# Patient Record
Sex: Female | Born: 1980 | Race: White | Hispanic: Yes | Marital: Married | Smoking: Former smoker
Health system: Southern US, Community
[De-identification: ages and names within clinical notes are randomized; demographics above are authoritative.]

## PROBLEM LIST (undated history)

## (undated) ENCOUNTER — Inpatient Hospital Stay (HOSPITAL_COMMUNITY): Payer: Self-pay

## (undated) DIAGNOSIS — D649 Anemia, unspecified: Secondary | ICD-10-CM

## (undated) DIAGNOSIS — E039 Hypothyroidism, unspecified: Secondary | ICD-10-CM

## (undated) DIAGNOSIS — T7840XA Allergy, unspecified, initial encounter: Secondary | ICD-10-CM

## (undated) HISTORY — DX: Allergy, unspecified, initial encounter: T78.40XA

## (undated) HISTORY — DX: Anemia, unspecified: D64.9

## (undated) HISTORY — PX: VULVA SURGERY: SHX837

## (undated) HISTORY — DX: Hypothyroidism, unspecified: E03.9

## (undated) HISTORY — PX: WISDOM TOOTH EXTRACTION: SHX21

---

## 2005-07-19 DIAGNOSIS — E039 Hypothyroidism, unspecified: Secondary | ICD-10-CM

## 2005-07-19 HISTORY — DX: Hypothyroidism, unspecified: E03.9

## 2016-06-22 DIAGNOSIS — Z30433 Encounter for removal and reinsertion of intrauterine contraceptive device: Secondary | ICD-10-CM | POA: Diagnosis not present

## 2016-11-24 ENCOUNTER — Encounter: Payer: Self-pay | Admitting: Internal Medicine

## 2016-11-24 ENCOUNTER — Ambulatory Visit (INDEPENDENT_AMBULATORY_CARE_PROVIDER_SITE_OTHER): Payer: BLUE CROSS/BLUE SHIELD | Admitting: Internal Medicine

## 2016-11-24 VITALS — BP 108/62 | HR 72 | Temp 98.2°F | Resp 14 | Ht 65.0 in | Wt 124.5 lb

## 2016-11-24 DIAGNOSIS — I7300 Raynaud's syndrome without gangrene: Secondary | ICD-10-CM | POA: Insufficient documentation

## 2016-11-24 DIAGNOSIS — E039 Hypothyroidism, unspecified: Secondary | ICD-10-CM | POA: Diagnosis not present

## 2016-11-24 DIAGNOSIS — Z0000 Encounter for general adult medical examination without abnormal findings: Secondary | ICD-10-CM

## 2016-11-24 DIAGNOSIS — Z114 Encounter for screening for human immunodeficiency virus [HIV]: Secondary | ICD-10-CM | POA: Diagnosis not present

## 2016-11-24 DIAGNOSIS — L989 Disorder of the skin and subcutaneous tissue, unspecified: Secondary | ICD-10-CM

## 2016-11-24 DIAGNOSIS — L853 Xerosis cutis: Secondary | ICD-10-CM

## 2016-11-24 NOTE — Progress Notes (Signed)
Subjective:    Patient ID: Sharon Burch, female    DOB: 08-26-1980, 36 y.o.   MRN: [id number]  DOS:  11/24/2016 Type of visit - description : cpx, new pt Interval history: In general feeling well, she has very healthy diet and exercise regularly.    Review of Systems She reports that for many years, when she is exposed to a cold environment, her fingers or toes get extremely white temporarily, better after she massage them. Also, over the last year has noted a "rash" on the lower extremities described as white dots. No itching, no scales    Other than above, a 14 point review of systems is negative    Past Medical History:  Diagnosis Date  . Allergy   . Anemia    hx of  . Hypothyroidism 2007   treated x 4 years, then apparently TSH normalized    Past Surgical History:  Procedure Laterality Date  . VULVA SURGERY     as child    Social History   Social History  . Marital status: Married    Spouse name: N/A  . Number of children: 0  . Years of education: N/A   Occupational History  . Clinical research associategraphic designer  [employer]   Social History Main Topics  . Smoking status: Current Every Day Smoker    Types: E-cigarettes  . Smokeless tobacco: Never Used     Comment: on-off   . Alcohol use Yes     Comment: socially  . Drug use: No  . Sexual activity: Yes    Birth control/ protection: IUD   Other Topics Concern  . Not on file   Social History Narrative   G0P0     Family History  Problem Relation Age of Onset  . Diabetes Maternal Grandmother   . Skin cancer Paternal Grandfather   . Heart failure Neg Hx   . Colon cancer Neg Hx   . Breast cancer Neg Hx      Allergies as of 11/24/2016      Reactions   Penicillins Rash      Medication List       Accurate as of 11/24/16 11:59 PM. Always use your most recent med list.          levonorgestrel 20 MCG/24HR IUD Commonly known as:  MIRENA 1 each by Intrauterine route once.          Objective:   Physical  Exam BP 108/62 (BP Location: Left Arm, Patient Position: Sitting, Cuff Size: Small)   Pulse 72   Temp 98.2 F (36.8 C) (Oral)   Resp 14   Ht 5\' 5"  (1.651 m)   Wt 124 lb 8 oz (56.5 kg)   SpO2 98%   BMI 20.72 kg/m     General:   Well developed, well nourished . NAD.  Neck: No  thyromegaly  HEENT:  Normocephalic . Face symmetric, atraumatic Lungs:  CTA B Normal respiratory effort, no intercostal retractions, no accessory muscle use. Heart: RRR,  no murmur.  No pretibial edema bilaterally  Abdomen:  Not distended, soft, non-tender. No rebound or rigidity.   Skin: Lower extremities without rash, the "white dots" seems to be a reticular pattern Neurologic:  alert & oriented X3.  Speech normal, gait appropriate for age and unassisted Strength symmetric and appropriate for age.  Psych: Cognition and judgment appear intact.  Cooperative with normal attention span and concentration.  Behavior appropriate. No anxious or depressed appearing.  Pictures taken by the pt  before:            Assessment & Plan:   Assessment Hypothyroidism:  Dx ~ 2007, treated x 4 years, then apparently TSH normalized Raynaud phenomena/disease  Plan: H/o thyroid dz, currently on no medications, checking PFTs Raynaud phenomena: Clearly has a Raynaud phenomena, see pictures taken by the patient previously, exam today is completely normal. She also have some reticular pattern at the legs. Will refer to rheumatology, I wonder if she needs further laboratory testing. For now recommend avoidance of triggers such as cold environment. Mole, dry scalp, would like to see a dermatologist. Will arrange RTC one year

## 2016-11-24 NOTE — Progress Notes (Signed)
Pre visit review using our clinic review tool, if applicable. No additional management support is needed unless otherwise documented below in the visit note. 

## 2016-11-24 NOTE — Patient Instructions (Signed)
GO TO THE LAB : Get the blood work     GO TO THE FRONT DESK Schedule your next appointment for a  physical exam in one year  

## 2016-11-25 DIAGNOSIS — Z09 Encounter for follow-up examination after completed treatment for conditions other than malignant neoplasm: Secondary | ICD-10-CM | POA: Insufficient documentation

## 2016-11-25 LAB — LIPID PANEL
CHOL/HDL RATIO: 3
Cholesterol: 186 mg/dL (ref 0–200)
HDL: 69.2 mg/dL (ref >39.00)
LDL Cholesterol: 100 mg/dL — ABNORMAL HIGH (ref 0–99)
NONHDL: 116.52
Triglycerides: 84 mg/dL (ref 0.0–149.0)
VLDL: 16.8 mg/dL (ref 0.0–40.0)

## 2016-11-25 LAB — COMPREHENSIVE METABOLIC PANEL
ALT: 16 U/L (ref 0–35)
AST: 18 U/L (ref 0–37)
Albumin: 5 g/dL (ref 3.5–5.2)
Alkaline Phosphatase: 37 U/L — ABNORMAL LOW (ref 39–117)
BUN: 14 mg/dL (ref 6–23)
CHLORIDE: 103 meq/L (ref 96–112)
CO2: 29 meq/L (ref 19–32)
Calcium: 9.9 mg/dL (ref 8.4–10.5)
Creatinine, Ser: 0.69 mg/dL (ref 0.40–1.20)
GFR: 103.03 mL/min (ref >60.00)
GLUCOSE: 92 mg/dL (ref 70–99)
POTASSIUM: 4 meq/L (ref 3.5–5.1)
SODIUM: 139 meq/L (ref 135–145)
TOTAL PROTEIN: 7.9 g/dL (ref 6.0–8.3)
Total Bilirubin: 0.3 mg/dL (ref 0.2–1.2)

## 2016-11-25 LAB — CBC WITH DIFFERENTIAL/PLATELET
Basophils Absolute: 0 10*3/uL (ref 0.0–0.1)
Basophils Relative: 0.5 % (ref 0.0–3.0)
EOS ABS: 0.1 10*3/uL (ref 0.0–0.7)
Eosinophils Relative: 0.9 % (ref 0.0–5.0)
HCT: 41 % (ref 36.0–46.0)
HEMOGLOBIN: 13.6 g/dL (ref 12.0–15.0)
Lymphocytes Relative: 18.2 % (ref 12.0–46.0)
Lymphs Abs: 1.9 10*3/uL (ref 0.7–4.0)
MCHC: 33.3 g/dL (ref 30.0–36.0)
MCV: 94.9 fl (ref 78.0–100.0)
MONO ABS: 0.5 10*3/uL (ref 0.1–1.0)
Monocytes Relative: 4.5 % (ref 3.0–12.0)
NEUTROS PCT: 75.9 % (ref 43.0–77.0)
Neutro Abs: 8.1 10*3/uL — ABNORMAL HIGH (ref 1.4–7.7)
Platelets: 270 10*3/uL (ref 150.0–400.0)
RBC: 4.32 Mil/uL (ref 3.87–5.11)
RDW: 13.6 % (ref 11.5–15.5)
WBC: 10.7 10*3/uL — AB (ref 4.0–10.5)

## 2016-11-25 LAB — T3, FREE: T3, Free: 3.4 pg/mL (ref 2.3–4.2)

## 2016-11-25 LAB — TSH: TSH: 0.87 u[IU]/mL (ref 0.35–4.50)

## 2016-11-25 LAB — HIV ANTIBODY (ROUTINE TESTING W REFLEX): HIV: NONREACTIVE

## 2016-11-25 LAB — T4, FREE: Free T4: 0.69 ng/dL (ref 0.60–1.60)

## 2016-11-25 NOTE — Assessment & Plan Note (Signed)
--  Td ~ 2017 --female care per gyn --Smoker: Counseling, previously Chantix causes nausea. We talk about different strategies to quit, the use of Wellbutrin and nicotine patch -- lifestyle is otherwise very healthy, she is active. Excellent diet. Labs: CMP, FLP, CBC, TSH, T3, T4, HIV.

## 2016-11-25 NOTE — Assessment & Plan Note (Signed)
H/o thyroid dz, currently on no medications, checking PFTs Raynaud phenomena: Clearly has a Raynaud phenomena, see pictures taken by the patient previously, exam today is completely normal. She also have some reticular pattern at the legs. Will refer to rheumatology, I wonder if she needs further laboratory testing. For now recommend avoidance of triggers such as cold environment. Mole, dry scalp, would like to see a dermatologist. Will arrange RTC one year

## 2016-12-01 ENCOUNTER — Telehealth: Payer: Self-pay | Admitting: *Deleted

## 2016-12-01 NOTE — Telephone Encounter (Signed)
Patient called about lab results.  She had not received them in the mail and saw them on mychart.  Advised her that all test look great.  See lab note.

## 2016-12-21 DIAGNOSIS — Z6820 Body mass index (BMI) 20.0-20.9, adult: Secondary | ICD-10-CM | POA: Diagnosis not present

## 2016-12-21 DIAGNOSIS — Z30432 Encounter for removal of intrauterine contraceptive device: Secondary | ICD-10-CM | POA: Diagnosis not present

## 2016-12-21 DIAGNOSIS — Z01419 Encounter for gynecological examination (general) (routine) without abnormal findings: Secondary | ICD-10-CM | POA: Diagnosis not present

## 2016-12-23 DIAGNOSIS — L700 Acne vulgaris: Secondary | ICD-10-CM | POA: Diagnosis not present

## 2016-12-23 DIAGNOSIS — L814 Other melanin hyperpigmentation: Secondary | ICD-10-CM | POA: Diagnosis not present

## 2016-12-23 DIAGNOSIS — L218 Other seborrheic dermatitis: Secondary | ICD-10-CM | POA: Diagnosis not present

## 2016-12-23 DIAGNOSIS — D485 Neoplasm of uncertain behavior of skin: Secondary | ICD-10-CM | POA: Diagnosis not present

## 2017-02-03 NOTE — Progress Notes (Deleted)
Office Visit Note  Patient: Sharon Burch             Date of Birth: Jan 03, 1981           MRN: [id number]             PCP: Wanda PlumpPaz, Jose E, MD Referring: Wanda PlumpPaz, Jose E, MD Visit Date: 02/08/2017 Occupation: @GUAROCC @    Subjective:  No chief complaint on file.   History of Present Illness: Sharon Burch is a 36 y.o. female ***   Activities of Daily Living:  Patient reports morning stiffness for *** {minute/hour:19697}.   Patient {ACTIONS;DENIES/REPORTS:21021675::"Denies"} nocturnal pain.  Difficulty dressing/grooming: {ACTIONS;DENIES/REPORTS:21021675::"Denies"} Difficulty climbing stairs: {ACTIONS;DENIES/REPORTS:21021675::"Denies"} Difficulty getting out of chair: {ACTIONS;DENIES/REPORTS:21021675::"Denies"} Difficulty using hands for taps, buttons, cutlery, and/or writing: {ACTIONS;DENIES/REPORTS:21021675::"Denies"}   No Rheumatology ROS completed.   PMFS History:  Patient Active Problem List   Diagnosis Date Noted  . PCP NOTES >>>>>>>>>>>>>>>>>> 11/25/2016  . Annual physical exam 11/24/2016  . Raynaud disease 11/24/2016  . Hypothyroidism 07/19/2005    Past Medical History:  Diagnosis Date  . Allergy   . Anemia    hx of  . Hypothyroidism 2007   treated x 4 years, then apparently TSH normalized    Family History  Problem Relation Age of Onset  . Diabetes Maternal Grandmother   . Skin cancer Paternal Grandfather   . Heart failure Neg Hx   . Colon cancer Neg Hx   . Breast cancer Neg Hx    Past Surgical History:  Procedure Laterality Date  . VULVA SURGERY     as child   Social History   Social History Narrative   G0P0     Objective: Vital Signs: There were no vitals taken for this visit.   Physical Exam   Musculoskeletal Exam: ***  CDAI Exam: No CDAI exam completed.    Investigation: Findings:  11/24/2016 TSH and HIV normal   CBC Latest Ref Rng & Units 11/24/2016  WBC 4.0 - 10.5 K/uL 10.7(H)  Hemoglobin 12.0 - 15.0 g/dL 40.913.6    Hematocrit 81.136.0 - 46.0 % 41.0  Platelets 150.0 - 400.0 K/uL 270.0   CMP Latest Ref Rng & Units 11/24/2016  Glucose 70 - 99 mg/dL 92  BUN 6 - 23 mg/dL 14  Creatinine 9.140.40 - 7.821.20 mg/dL 9.560.69  Sodium 213135 - 086145 mEq/L 139  Potassium 3.5 - 5.1 mEq/L 4.0  Chloride 96 - 112 mEq/L 103  CO2 19 - 32 mEq/L 29  Calcium 8.4 - 10.5 mg/dL 9.9  Total Protein 6.0 - 8.3 g/dL 7.9  Total Bilirubin 0.2 - 1.2 mg/dL 0.3  Alkaline Phos 39 - 117 U/L 37(L)  AST 0 - 37 U/L 18  ALT 0 - 35 U/L 16     Imaging: No results found.  Speciality Comments: No specialty comments available.    Procedures:  No procedures performed Allergies: Penicillins   Assessment / Plan:     Visit Diagnoses: Raynaud's disease without gangrene  History of hypothyroidism  History of anemia    Orders: No orders of the defined types were placed in this encounter.  No orders of the defined types were placed in this encounter.   Face-to-face time spent with patient was *** minutes. 50% of time was spent in counseling and coordination of care.  Follow-Up Instructions: No Follow-up on file.   Amy Littrell, RT  Note - This record has been created using [employer] software.  Chart creation errors have been sought, but may not always  have been  located. Such creation errors do not reflect on  the standard of medical care.

## 2017-02-07 NOTE — Progress Notes (Signed)
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   History of Present Illness: Sharon Burch is a 36 y.o. female seen in consultation per request of her PCP for evaluation of Raynaud's phenomenon. According to patient she has had symptoms of Raynauds and she was some 19. She reports that her body is exposed to cold temperature she's noticed that couple of her fingers will turn white and numb. She's also noticed changes in her toes. She's also seen some reticular pattern on her lower extremities. She states she has had problems with her knee joints for a while. Especially when she is going downhill. She has not noticed any swelling in her knee joints. She has some stiffness in her feet in the morning.  Activities of Daily Living:  Patient reports morning stiffness for 0 minute.   Patient Denies nocturnal pain.  Difficulty dressing/grooming: Denies Difficulty climbing stairs: Denies Difficulty getting out of chair: Denies Difficulty using hands for taps, buttons, cutlery, and/or writing: Denies   Review of Systems  Constitutional: Negative for fatigue, night sweats, weight gain, weight loss and weakness.  HENT: Negative for mouth sores, trouble swallowing, trouble swallowing, mouth dryness and nose dryness.   Eyes: Positive for dryness. Negative for pain, redness and visual disturbance.  Respiratory: Negative for cough, shortness of breath and difficulty breathing.   Cardiovascular: Negative for chest pain, palpitations, hypertension, irregular heartbeat and swelling in legs/feet.  Gastrointestinal: Negative for blood in stool, constipation and diarrhea.  Endocrine: Negative for increased urination.  Genitourinary: Negative for vaginal dryness.  Musculoskeletal: Positive for  arthralgias and joint pain. Negative for joint swelling, myalgias, muscle weakness, morning stiffness, muscle tenderness and myalgias.  Skin: Positive for color change and sensitivity to sunlight. Negative for rash, hair loss, skin tightness and ulcers.  Allergic/Immunologic: Negative for susceptible to infections.  Neurological: Negative for dizziness, memory loss and night sweats.  Hematological: Negative for swollen glands.  Psychiatric/Behavioral: Negative for depressed mood and sleep disturbance. The patient is not nervous/anxious.     PMFS History:  Patient Active Problem List   Diagnosis Date Noted  . PCP NOTES >>>>>>>>>>>>>>>>>> 11/25/2016  . Annual physical exam 11/24/2016  . Raynaud disease 11/24/2016  . Hypothyroidism 07/19/2005    Past Medical History:  Diagnosis Date  . Allergy   . Anemia    hx of  . Hypothyroidism 2007   treated x 4 years, then apparently TSH normalized    Family History  Problem Relation Age of Onset  . Diabetes Maternal Grandmother   . Skin cancer Paternal Grandfather   . Heart failure Neg Hx   . Colon cancer Neg Hx   . Breast cancer Neg Hx    Past Surgical History:  Procedure Laterality Date  . VULVA SURGERY     as child   Social History   Social History Narrative   G0P0     Objective: Vital Signs: BP 112/80 (BP Location: Left Arm, Patient Position: Sitting, Cuff Size: Normal)   Pulse 72   Ht 5\' 5"  (1.651 m)   Wt 135 lb (61.2 kg)   BMI 22.47 kg/m    Physical Exam  Constitutional:  She is oriented to person, place, and time. She appears well-developed and well-nourished.  HENT:  Head: Normocephalic and atraumatic.  Eyes: Conjunctivae and EOM are normal.  Neck: Normal range of motion.  Cardiovascular: Normal rate, regular rhythm, normal heart sounds and intact distal pulses.   Pulmonary/Chest: Effort normal and breath sounds normal.  Abdominal: Soft. Bowel sounds are normal.  Lymphadenopathy:    She has no cervical  adenopathy.  Neurological: She is alert and oriented to person, place, and time.  Skin: Skin is warm and dry. Capillary refill takes 2 to 3 seconds.  Psychiatric: She has a normal mood and affect. Her behavior is normal.  Nursing note and vitals reviewed.    Musculoskeletal Exam: C-spine and thoracic lumbar spine good range of motion. Shoulder joints although joints was joint MCPs PIPs DIPs with good range of motion. Hip joints knee joints ankles MTPs PIPs DIPs are good range of motion. She's crepitus with range of motion of her bilateral knee joints. No warmth swelling or effusion was noted.  CDAI Exam: No CDAI exam completed.    Investigation: No additional findings. 11/24/2016 CBC WBC 10.7, CMP alkaline phosphatase low at 37, lipid panel LDL 100, TSH 0.87 normal, HIV nonreactive  Imaging: Xr Knee 3 View Left  Result Date: 02/09/2017 No joint space narrowing was noted. Mild patellofemoral narrowing was noted. No chondrocalcinosis was noted. Mild chondromalacia patella  Xr Knee 3 View Right  Result Date: 02/09/2017 No joint space narrowing was noted. Mild patellofemoral narrowing was noted. No chondrocalcinosis was noted. Mild chondromalacia patella   Speciality Comments: No specialty comments available.    Procedures:  No procedures performed Allergies: Penicillins   Assessment / Plan:     Visit Diagnoses: Raynaud's disease without gangrene - patient is some moderate to severe Raynauds. Sheilah pictures of her hands where couple of her fingers were almost white. She also had pictures of livedo reticularis on her bilateral lower extremities.  Plan: CBC with Differential/Platelet, CK, ANA, Rheumatoid factor, Serum protein electrophoresis with reflex, Glucose 6 phosphate dehydrogenase, Beta-2 glycoprotein antibodies, Lupus anticoagulant panel, Cardiolipin antibodies, IgG, IgM, IgA, C3 and C4, CP5000020 ENA PANEL, Hepatitis C antibody, Hepatitis B surface antigen, Hepatitis B core  antibody, IgM, Cryoglobulin, Sedimentation rate, Pan-ANCA  Livedo reticularis  Chronic pain of both knees - Plan: XR KNEE 3 VIEW RIGHT, XR KNEE 3 VIEW LEFT. X-rays revealed only mild chondromalacia patella. Knee joint and muscle strengthening was discussed.    Orders: Orders Placed This Encounter  Procedures  . XR KNEE 3 VIEW RIGHT  . XR KNEE 3 VIEW LEFT  . CBC with Differential/Platelet  . CK  . ANA  . Rheumatoid factor  . Serum protein electrophoresis with reflex  . Glucose 6 phosphate dehydrogenase  . Beta-2 glycoprotein antibodies  . Lupus anticoagulant panel  . Cardiolipin antibodies, IgG, IgM, IgA  . C3 and C4  . CP5000020 ENA PANEL  . Hepatitis C antibody  . Hepatitis B surface antigen  . Hepatitis B core antibody, IgM  . Cryoglobulin  . Sedimentation rate  . Pan-ANCA   No orders of the defined types were placed in this encounter.   Face-to-face time spent with patient was 45 minutes. 50% of time was spent in counseling and coordination of care.  Follow-Up Instructions: Return for Raynaud's.   Pollyann Savoy, MD  Note - This record has been created using Animal nutritionist.  Chart creation errors have been sought, but may not always  have been located. Such creation errors  do not reflect on  the standard of medical care.

## 2017-02-08 ENCOUNTER — Ambulatory Visit: Payer: Self-pay | Admitting: Rheumatology

## 2017-02-09 ENCOUNTER — Ambulatory Visit (INDEPENDENT_AMBULATORY_CARE_PROVIDER_SITE_OTHER): Payer: Self-pay

## 2017-02-09 ENCOUNTER — Ambulatory Visit (INDEPENDENT_AMBULATORY_CARE_PROVIDER_SITE_OTHER): Payer: BLUE CROSS/BLUE SHIELD | Admitting: Rheumatology

## 2017-02-09 ENCOUNTER — Encounter: Payer: Self-pay | Admitting: Rheumatology

## 2017-02-09 VITALS — BP 112/80 | HR 72 | Ht 65.0 in | Wt 135.0 lb

## 2017-02-09 DIAGNOSIS — R231 Pallor: Secondary | ICD-10-CM

## 2017-02-09 DIAGNOSIS — M25562 Pain in left knee: Secondary | ICD-10-CM | POA: Diagnosis not present

## 2017-02-09 DIAGNOSIS — I7300 Raynaud's syndrome without gangrene: Secondary | ICD-10-CM | POA: Diagnosis not present

## 2017-02-09 DIAGNOSIS — G8929 Other chronic pain: Secondary | ICD-10-CM | POA: Diagnosis not present

## 2017-02-09 DIAGNOSIS — M25561 Pain in right knee: Secondary | ICD-10-CM

## 2017-02-09 LAB — CBC WITH DIFFERENTIAL/PLATELET
Basophils Absolute: 0 cells/uL (ref 0–200)
Basophils Relative: 0 %
EOS PCT: 1 %
Eosinophils Absolute: 69 cells/uL (ref 15–500)
HEMATOCRIT: 42.8 % (ref 35.0–45.0)
Hemoglobin: 14.3 g/dL (ref 11.7–15.5)
LYMPHS PCT: 19 %
Lymphs Abs: 1311 cells/uL (ref 850–3900)
MCH: 32 pg (ref 27.0–33.0)
MCHC: 33.4 g/dL (ref 32.0–36.0)
MCV: 95.7 fL (ref 80.0–100.0)
MPV: 9.3 fL (ref 7.5–12.5)
Monocytes Absolute: 414 cells/uL (ref 200–950)
Monocytes Relative: 6 %
NEUTROS PCT: 74 %
Neutro Abs: 5106 cells/uL (ref 1500–7800)
Platelets: 311 10*3/uL (ref 140–400)
RBC: 4.47 MIL/uL (ref 3.80–5.10)
RDW: 13.3 % (ref 11.0–15.0)
WBC: 6.9 10*3/uL (ref 3.8–10.8)

## 2017-02-10 LAB — C3 AND C4
C3 COMPLEMENT: 122 mg/dL (ref 83–193)
C4 COMPLEMENT: 25 mg/dL (ref 15–57)

## 2017-02-10 LAB — SEDIMENTATION RATE: SED RATE: 4 mm/h (ref 0–20)

## 2017-02-10 LAB — PAN-ANCA
ANCA Screen: NEGATIVE
Myeloperoxidase Abs: <1

## 2017-02-10 LAB — CP5000020 ENA PANEL
ENA SM Ab Ser-aCnc: <1
Ribonucleic Protein(ENA) Antibody, IgG: <1
SCLERODERMA (SCL-70) (ENA) ANTIBODY, IGG: NEGATIVE
SSA (Ro) (ENA) Antibody, IgG: <1
SSB (La) (ENA) Antibody, IgG: <1
ds DNA Ab: 1 IU/mL

## 2017-02-10 LAB — BETA-2 GLYCOPROTEIN ANTIBODIES
Beta-2-Glycoprotein I IgA: <9 SAU (ref <=20)
Beta-2-Glycoprotein I IgM: <9 SMU (ref <=20)

## 2017-02-10 LAB — HEPATITIS B SURFACE ANTIGEN: HEP B S AG: NEGATIVE

## 2017-02-10 LAB — CK: CK TOTAL: 88 U/L (ref 29–143)

## 2017-02-10 LAB — CARDIOLIPIN ANTIBODIES, IGG, IGM, IGA: Anticardiolipin IgG: <14 [GPL'U]

## 2017-02-10 LAB — RHEUMATOID FACTOR: Rhuematoid fact SerPl-aCnc: <14 IU/mL (ref <14)

## 2017-02-10 LAB — HEPATITIS B CORE ANTIBODY, IGM: HEP B C IGM: NONREACTIVE

## 2017-02-10 LAB — HEPATITIS C ANTIBODY: HCV AB: NEGATIVE

## 2017-02-10 LAB — GLUCOSE 6 PHOSPHATE DEHYDROGENASE: G-6PDH: 14 U/g Hgb (ref 7.0–20.5)

## 2017-02-11 LAB — ANA: ANA: NEGATIVE

## 2017-02-12 LAB — RFX PTT-LA W/RFX TO HEX PHASE CONF: PTT-LA SCREEN: 38 s (ref <=40)

## 2017-02-12 LAB — RFX DRVVT SCR W/RFLX CONF 1:1 MIX: dRVVT Screen: 33 s (ref <=45)

## 2017-02-12 LAB — LUPUS ANTICOAGULANT PANEL

## 2017-02-14 LAB — PROTEIN ELECTROPHORESIS, SERUM, WITH REFLEX
ALPHA-1-GLOBULIN: 0.3 g/dL (ref 0.2–0.3)
Albumin ELP: 5.2 g/dL — ABNORMAL HIGH (ref 3.8–4.8)
Alpha-2-Globulin: 0.9 g/dL (ref 0.5–0.9)
BETA 2: 0.4 g/dL (ref 0.2–0.5)
BETA GLOBULIN: 0.5 g/dL (ref 0.4–0.6)
GAMMA GLOBULIN: 1.3 g/dL (ref 0.8–1.7)
Total Protein, Serum Electrophoresis: 8.6 g/dL — ABNORMAL HIGH (ref 6.1–8.1)

## 2017-02-15 LAB — CRYOGLOBULIN

## 2017-02-16 NOTE — Progress Notes (Signed)
WNL

## 2017-03-14 DIAGNOSIS — M2240 Chondromalacia patellae, unspecified knee: Secondary | ICD-10-CM | POA: Insufficient documentation

## 2017-03-14 DIAGNOSIS — R231 Pallor: Secondary | ICD-10-CM | POA: Insufficient documentation

## 2017-03-14 NOTE — Progress Notes (Signed)
 Office Visit Note  Patient: Sharon Burch             Date of Birth: 03/29/1981           MRN: [id number]             PCP: Paz, Jose E, MD Referring: Paz, Jose E, MD Visit Date: 03/16/2017 Occupation: @GUAROCC@    Subjective:  Raynauds   History of Present Illness: Sharon Burch is a 36 y.o. female with history of Raynaud's phenomena and she continues to have mild symptoms of Raynauds. She denies any digital ulcers. She's also noticed discoloration on lower extremities. She states she has had some loose stools but not diarrhea area and she denies any blood in her stool.  Activities of Daily Living:  Patient reports morning stiffness for 0 minute.   Patient Denies nocturnal pain.  Difficulty dressing/grooming: Denies Difficulty climbing stairs: Denies Difficulty getting out of chair: Denies Difficulty using hands for taps, buttons, cutlery, and/or writing: Denies   Review of Systems  Constitutional: Negative for fatigue, night sweats, weight gain, weight loss and weakness.  HENT: Negative for mouth sores, trouble swallowing, trouble swallowing, mouth dryness and nose dryness.   Eyes: Positive for dryness. Negative for pain, redness and visual disturbance.  Respiratory: Negative for cough, shortness of breath and difficulty breathing.   Cardiovascular: Negative for chest pain, palpitations, hypertension, irregular heartbeat and swelling in legs/feet.  Gastrointestinal: Negative for blood in stool, constipation and diarrhea.  Endocrine: Negative for increased urination.  Genitourinary: Negative for vaginal dryness.  Musculoskeletal: Negative for arthralgias, joint pain, joint swelling, myalgias, muscle weakness, morning stiffness, muscle tenderness and myalgias.  Skin: Positive for color change. Negative for rash, hair loss, skin tightness, ulcers and sensitivity to sunlight.  Allergic/Immunologic: Negative for susceptible to infections.  Neurological: Negative for  dizziness, memory loss and night sweats.  Hematological: Negative for swollen glands.  Psychiatric/Behavioral: Negative for depressed mood and sleep disturbance. The patient is not nervous/anxious.     PMFS History:  Patient Active Problem List   Diagnosis Date Noted  . 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 PCP NOTES >>>>>>>>>>>>>>>>>> 11/25/2016  . Annual physical exam 11/24/2016  . Raynaud disease 11/24/2016  . Hypothyroidism 07/19/2005    Past Medical History:  Diagnosis Date  . Allergy   . Anemia    hx of  . Hypothyroidism 2007   treated x 4 years, then apparently TSH normalized    Family History  Problem Relation Age of Onset  . Diabetes Maternal Grandmother   . Skin cancer Paternal Grandfather   . Heart failure Neg Hx   . Colon cancer Neg Hx   . Breast cancer Neg Hx    Past Surgical History:  Procedure Laterality Date  . VULVA SURGERY     as child   Social History   Social History Narrative   G0P0     Objective: Vital Signs: BP 110/60   Pulse 74   Resp 14   Ht 5' 5" (1.651 m)   Wt 130 lb (59 kg)   LMP 02/25/2017   BMI 21.63 kg/m    Physical Exam  Constitutional: She is oriented to person, place, and time. She appears well-developed and well-nourished.  HENT:  Head: Normocephalic and atraumatic.  Eyes: Conjunctivae and EOM are normal.  Neck: Normal range of motion.  Cardiovascular: Normal rate, regular rhythm, normal heart sounds and intact distal pulses.   Pulmonary/Chest: Effort normal and breath sounds normal.    Abdominal: Soft. Bowel sounds are normal.  Lymphadenopathy:    She has no cervical adenopathy.  Neurological: She is alert and oriented to person, place, and time.  Skin: Skin is warm and dry. Capillary refill takes less than 2 seconds.  Psychiatric: She has a normal mood and affect. Her behavior is normal.  Nursing note and vitals reviewed.    Musculoskeletal Exam: C-spine and thoracic lumbar  spine good range of motion. Shoulder joints although joints wrist joint MCPs PIPs DIPs are good range of motion. There was no synovitis on examination.  CDAI Exam: No CDAI exam completed.    Investigation: No additional findings. July 20 15,018 CBC normal, CK 88 SPEP nonspecific, G6PD normal hepatitis panel negative ESR 4, ANA negative, ENA negative, C3-C4 normal, beta-2 negative, anti-cardiolipin negative, lupus Anticoagulant negative, cryoglobulins negative, RF negative, ANCA negative  Imaging: No results found.  Speciality Comments: No specialty comments available.    Procedures:  No procedures performed Allergies: Penicillins   Assessment / Plan:     Visit Diagnoses: Raynaud's disease without gangrene-severe - All autoimmune workup negative. Detailed counseling was provided. I've advised her to contact me if she develops any new symptoms. I have given her a prescription for Norvasc 2.5 mg by mouth daily at bedtime which she can use during the winter season. I've advised her to contact her OB regarding the use of Norvasc as she is planning pregnancy near future. Side effects of Norvasc were discussed. I've also advised to monitor blood pressure while she is on Norvasc. Use of warm clothing and keeping core temperature warm was also discussed.  Livedo reticularis: Stable  Chondromalacia of patella, bilateral - Mild , lower extremity muscle strengthening exercises were discussed.   Orders: No orders of the defined types were placed in this encounter.  Meds ordered this encounter  Medications  . amLODipine (NORVASC) 5 MG tablet    Sig: Take 0.5 tablets (2.5 mg total) by mouth daily.    Dispense:  15 tablet    Refill:  3     Follow-Up Instructions: Return in about 1 year (around 03/16/2018) for Raynauds.   Bo Merino, MD  Note - This record has been created using Editor, commissioning.  Chart creation errors have been sought, but may not always  have been located. Such  creation errors do not reflect on  the standard of medical care.

## 2017-03-16 ENCOUNTER — Ambulatory Visit (INDEPENDENT_AMBULATORY_CARE_PROVIDER_SITE_OTHER): Payer: BLUE CROSS/BLUE SHIELD | Admitting: Rheumatology

## 2017-03-16 ENCOUNTER — Encounter: Payer: Self-pay | Admitting: Rheumatology

## 2017-03-16 VITALS — BP 110/60 | HR 74 | Resp 14 | Ht 65.0 in | Wt 130.0 lb

## 2017-03-16 DIAGNOSIS — I7300 Raynaud's syndrome without gangrene: Secondary | ICD-10-CM | POA: Diagnosis not present

## 2017-03-16 DIAGNOSIS — R231 Pallor: Secondary | ICD-10-CM

## 2017-03-16 DIAGNOSIS — M2240 Chondromalacia patellae, unspecified knee: Secondary | ICD-10-CM | POA: Diagnosis not present

## 2017-03-16 MED ORDER — AMLODIPINE BESYLATE 5 MG PO TABS: 2.5000 mg | ORAL_TABLET | Freq: Every day | ORAL | 3 refills | Status: DC

## 2017-04-07 ENCOUNTER — Emergency Department (HOSPITAL_COMMUNITY)
Admission: EM | Admit: 2017-04-07 | Discharge: 2017-04-07 | Discharge status: Against Medical Advice | Payer: BLUE CROSS/BLUE SHIELD

## 2017-04-11 ENCOUNTER — Encounter: Payer: Self-pay | Admitting: Internal Medicine

## 2017-04-11 ENCOUNTER — Ambulatory Visit (INDEPENDENT_AMBULATORY_CARE_PROVIDER_SITE_OTHER): Payer: BLUE CROSS/BLUE SHIELD | Admitting: Internal Medicine

## 2017-04-11 ENCOUNTER — Ambulatory Visit (HOSPITAL_BASED_OUTPATIENT_CLINIC_OR_DEPARTMENT_OTHER)
Admission: RE | Admit: 2017-04-11 | Discharge: 2017-04-11 | Disposition: A | Discharge status: Home / Self Care | Payer: BLUE CROSS/BLUE SHIELD | Source: Ambulatory Visit | Attending: Internal Medicine | Admitting: Internal Medicine

## 2017-04-11 VITALS — BP 118/70 | HR 72 | Temp 98.2°F | Resp 14 | Ht 65.0 in | Wt 127.5 lb

## 2017-04-11 DIAGNOSIS — N3001 Acute cystitis with hematuria: Secondary | ICD-10-CM

## 2017-04-11 DIAGNOSIS — N218 Other lower urinary tract calculus: Secondary | ICD-10-CM

## 2017-04-11 DIAGNOSIS — N209 Urinary calculus, unspecified: Secondary | ICD-10-CM | POA: Diagnosis not present

## 2017-04-11 DIAGNOSIS — I7300 Raynaud's syndrome without gangrene: Secondary | ICD-10-CM | POA: Diagnosis not present

## 2017-04-11 LAB — POC URINALSYSI DIPSTICK (AUTOMATED)
BILIRUBIN UA: NEGATIVE
GLUCOSE UA: NEGATIVE
Ketones, UA: NEGATIVE
Leukocytes, UA: NEGATIVE
NITRITE UA: NEGATIVE
PH UA: 6 (ref 5.0–8.0)
UROBILINOGEN UA: 0.2 U/dL

## 2017-04-11 LAB — URINALYSIS, ROUTINE W REFLEX MICROSCOPIC
Leukocytes, UA: NEGATIVE
NITRITE: NEGATIVE
Urine Glucose: NEGATIVE
Urobilinogen, UA: 0.2 (ref 0.0–1.0)
pH: 5.5 (ref 5.0–8.0)

## 2017-04-11 LAB — POCT URINE PREGNANCY: Preg Test, Ur: NEGATIVE

## 2017-04-11 NOTE — Progress Notes (Signed)
Pre visit review using our clinic review tool, if applicable. No additional management support is needed unless otherwise documented below in the visit note. 

## 2017-04-11 NOTE — Progress Notes (Signed)
Subjective:    Patient ID: Sharon Burch, female    DOB: 24-Sep-1980, 35 y.o.   MRN: [id number]  DOS:  04/11/2017 Type of visit - description : acute Interval history: Problems started 04/06/2017 with urinary symptoms: Frequency, dysuria, urgency. At the same time she developed pain at the right lower abdomen and right low back. The pain was intense, sharp, steady. The pain peaked on 04/08/2017, she went to the ER but left without being seen. The pain subsided the next day and is now essentially gone. Urinary symptoms continue. Last menstrual period started 03/22/2017  Review of Systems No fever chills No blood in the urine, no vomiting but had some nausea when the pain was at its worse. Denies any rash, no vaginal bleeding or discharge No gross hematuria hemorrhoids have been exacerbated, has developed some pain in the area and feels like her hemorrhoids have increase in size. No bleeding.   Past Medical History:  Diagnosis Date  . Allergy   . Anemia    hx of  . Hypothyroidism 2007   treated x 4 years, then apparently TSH normalized    Past Surgical History:  Procedure Laterality Date  . VULVA SURGERY     as child    Social History   Social History  . Marital status: Married    Spouse name: N/A  . Number of children: 0  . Years of education: N/A   Occupational History  . Clinical research associate   Social History Main Topics  . Smoking status: Current Every Day Smoker    Types: Cigarettes  . Smokeless tobacco: Never Used     Comment: 5 -6 cigarettes a day  . Alcohol use Yes     Comment: socially  . Drug use: No  . Sexual activity: Yes    Birth control/ protection: IUD   Other Topics Concern  . Not on file   Social History Narrative   G0P0      Allergies as of 04/11/2017      Reactions   Penicillins Rash      Medication List       Accurate as of 04/11/17 11:59 PM. Always use your most recent med list.          folic acid 400 MCG  tablet Commonly known as:  FOLVITE Take 800 mcg by mouth daily.            Discharge Care Instructions        Start     Ordered   04/11/17 0000  POCT Urinalysis Dipstick (Automated)     04/11/17 1009   04/11/17 0000  POCT urine pregnancy     04/11/17 1009   04/11/17 0000  Urine Culture     04/11/17 1009   04/11/17 0000  Urinalysis, Routine w reflex microscopic     04/11/17 1009   04/11/17 0000  US Renal    Question Answer Comment  Reason for Exam (SYMPTOM  OR DIAGNOSIS REQUIRED) urolithiasis   Preferred imaging location? [facility]      04/11/17 1010         Objective:   Physical Exam BP 118/70 (BP Location: Left Arm, Patient Position: Sitting, Cuff Size: Small)   Pulse 72   Temp 98.2 F (36.8 C) (Oral)   Resp 14   Ht  (1.651 m)   Wt 127 lb 8 oz (57.8 kg)   LMP 03/22/2017 (Exact Date)   SpO2 98%   BMI 21.22 kg/m  General:   Well developed, well nourished . NAD.  HEENT:  Normocephalic . Face symmetric, atraumatic Lungs:  CTA B Normal respiratory effort, no intercostal retractions, no accessory muscle use. Heart: RRR,  no murmur.  no pretibial edema bilaterally  Abdomen:  Not distended, soft, non-tender. No rebound or rigidity. No CVA tenderness Skin: Not pale. Not jaundice Neurologic:  alert & oriented X3.  Speech normal, gait appropriate for age and unassisted Psych--  Cognition and judgment appear intact.  Cooperative with normal attention span and concentration.  Behavior appropriate. No anxious or depressed appearing.    Assessment & Plan:   Assessment new pt 11-2016 Hypothyroidism:  Dx ~ 2007, treated x 4 years, then apparently TSH normalized Raynaud phenomena/disease  Plan: Urolithiasis ? : Suspect urolithiasis on clinical grounds, UPT negative, UA + blood. Recommend a renal ultrasound to r/o obstruction, UA, urine culture. Consider further eval if symptoms do not completely resolve or if they resurface. Hemorrhoids w/  recent exacerbation: Conservative treatment, call if no better Raynaud's phenomena: s/p eval by rheumatology, additional labs were done, was RX  Amlodipine but not taking d/t no been on BCP  and is trying to get pregnant.

## 2017-04-11 NOTE — Patient Instructions (Signed)
  Drink plenty of fluids  They will  call you and schedule a renal ultrasound  If you are not completely well in 4-5 days let us know  If the symptoms resurface, you have fever, chills, blood in the urine: Let us know immediately.

## 2017-04-12 LAB — URINE CULTURE
MICRO NUMBER:: 81053907
SPECIMEN QUALITY: ADEQUATE

## 2017-04-12 NOTE — Assessment & Plan Note (Signed)
Urolithiasis ? : Suspect urolithiasis on clinical grounds, UPT negative, UA + blood. Recommend a renal ultrasound to r/o obstruction, UA, urine culture. Consider further eval if symptoms do not completely resolve or if they resurface. Hemorrhoids w/ recent exacerbation: Conservative treatment, call if no better Raynaud's phenomena: s/p eval by rheumatology, additional labs were done, was RX  Amlodipine but not taking d/t no been on BCP  and is trying to get pregnant.

## 2017-04-13 ENCOUNTER — Telehealth: Payer: Self-pay | Admitting: Internal Medicine

## 2017-04-13 NOTE — Telephone Encounter (Signed)
Pt called for results of Renal US done 04/11/17. Please call pt today [phone].

## 2017-04-13 NOTE — Telephone Encounter (Signed)
Notes recorded by Conrad [place], CMA on 04/11/2017 at 5:13 PM EDT Results released to MyChart. ------  Notes recorded by Wanda Plump, MD on 04/11/2017 at 4:52 PM EDT Release these results to MyChart with attached comments  Preeti, the ultrasound is normal meaning that there is no obstruction and we have not visualized a stone. The urine tests are still pending.

## 2017-04-13 NOTE — Telephone Encounter (Signed)
Spoke w/ Pt, informed of results. Pt verbalized understanding.

## 2017-07-05 DIAGNOSIS — Z3169 Encounter for other general counseling and advice on procreation: Secondary | ICD-10-CM | POA: Diagnosis not present

## 2017-10-04 DIAGNOSIS — N912 Amenorrhea, unspecified: Secondary | ICD-10-CM | POA: Diagnosis not present

## 2017-10-21 DIAGNOSIS — O09521 Supervision of elderly multigravida, first trimester: Secondary | ICD-10-CM | POA: Diagnosis not present

## 2017-10-24 IMAGING — US US RENAL
1 series · 14 of 25 positions shown · non-contrast
Comparison: None.

CLINICAL DATA: Acute cystitis, urolithiasis. History of frequency,
dysuria, urgency, right lower abdominal and back pain.

EXAM:
RENAL / URINARY TRACT ULTRASOUND COMPLETE

[Series 1: us renal · 0.22mm/px · 14 of 36 slices shown]
[im 1/36]
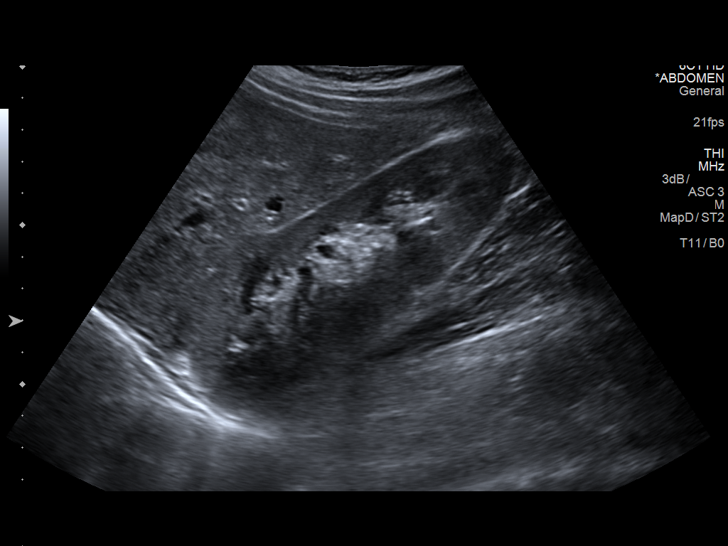
[im 3/36]
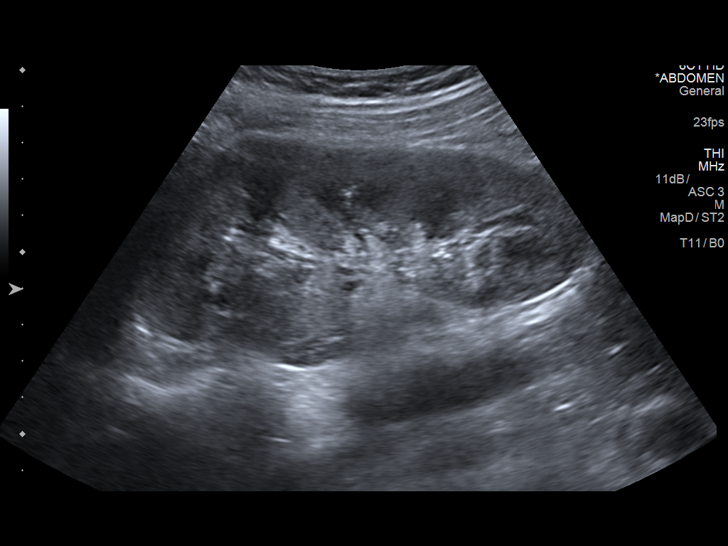
[im 6/36]
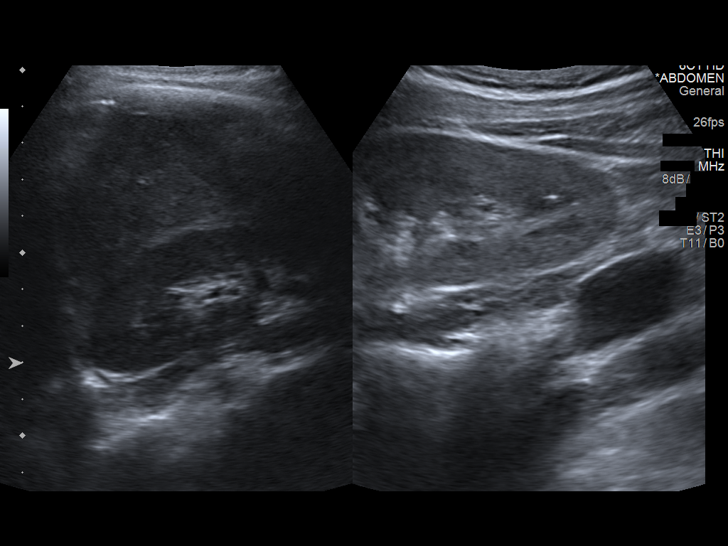
[im 9/36]
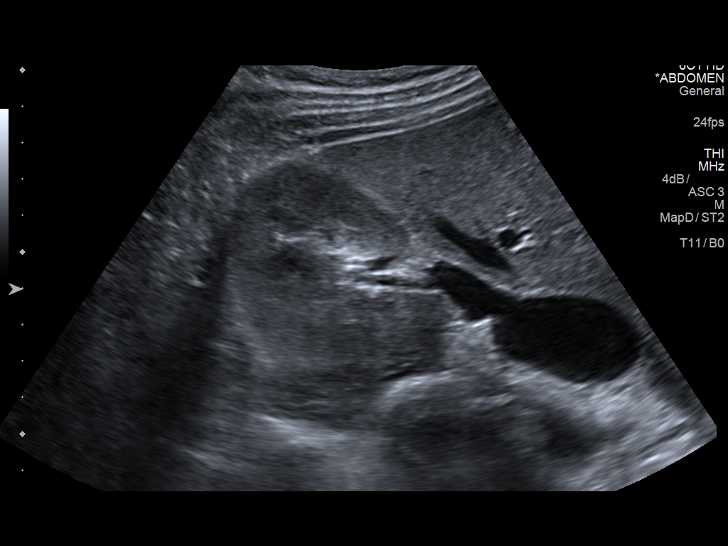
[im 12/36]
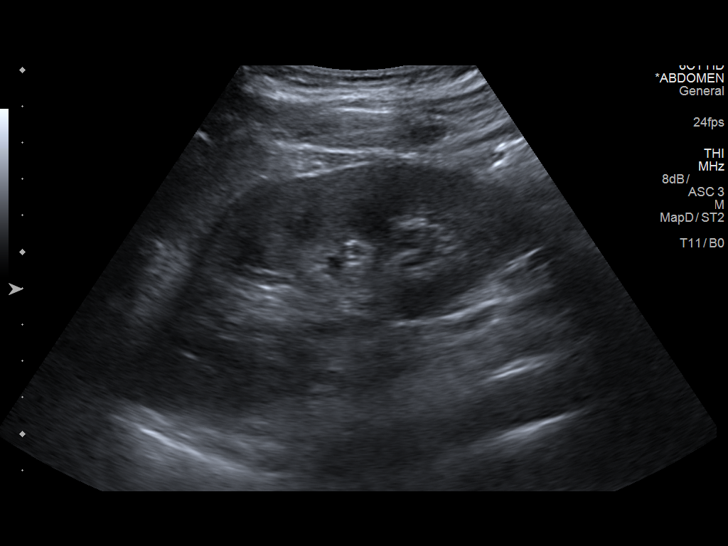
[im 14/36]
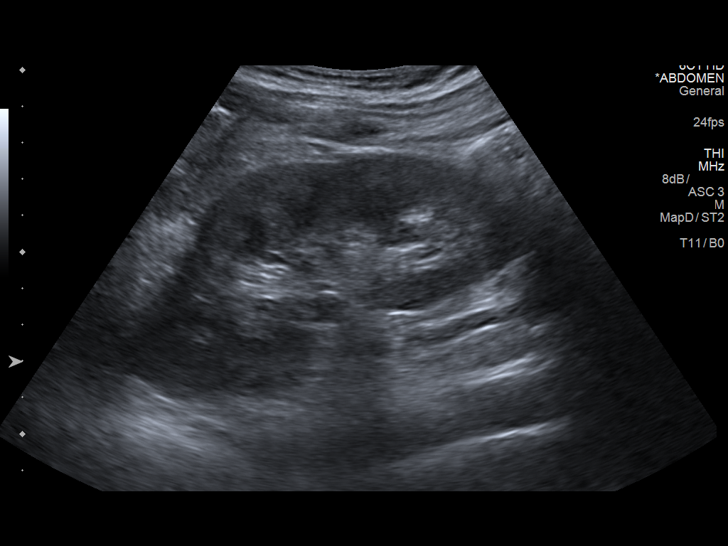
[im 17/36]
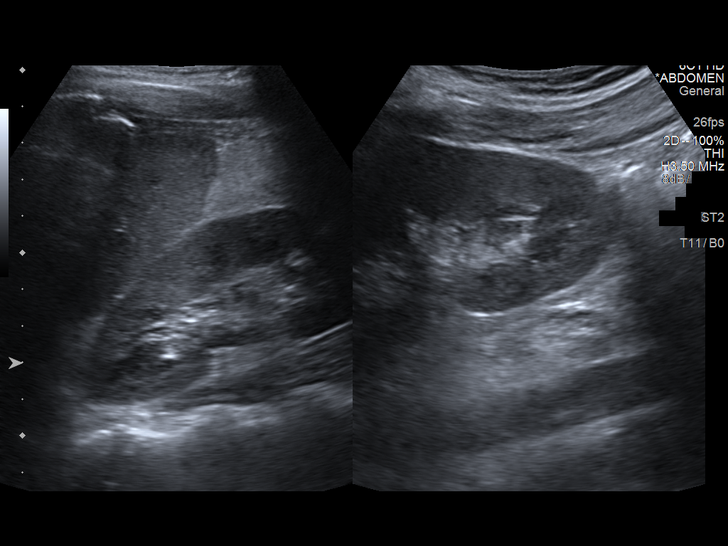
[im 19/36]
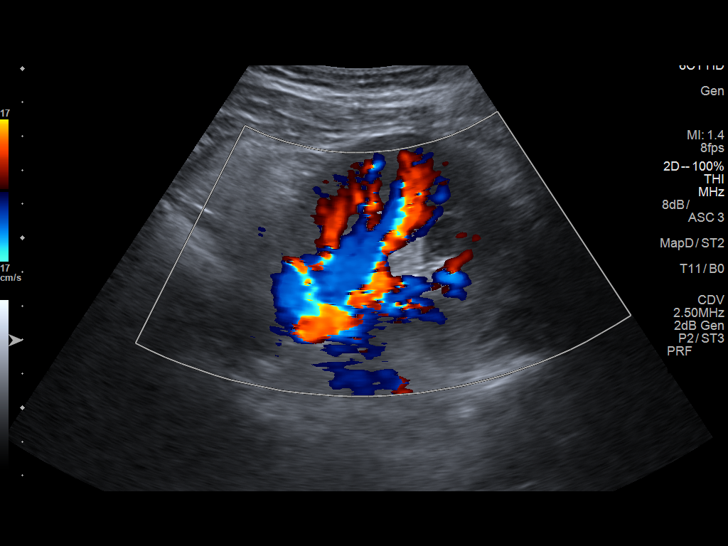
[im 22/36]
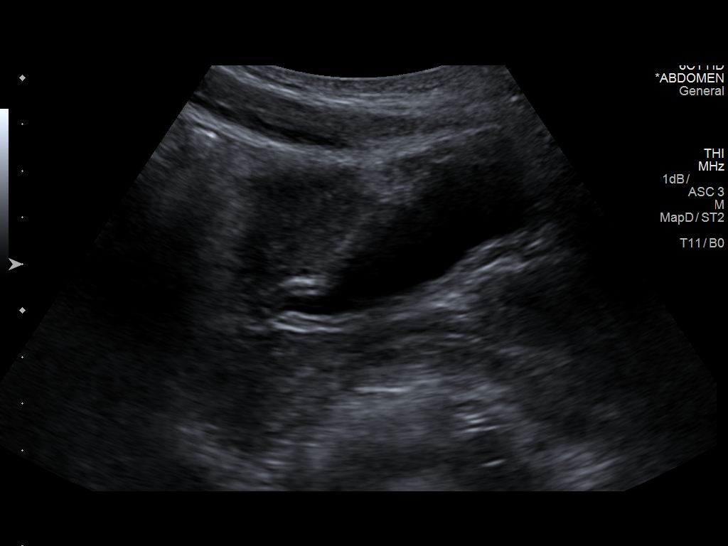
[im 24/36]
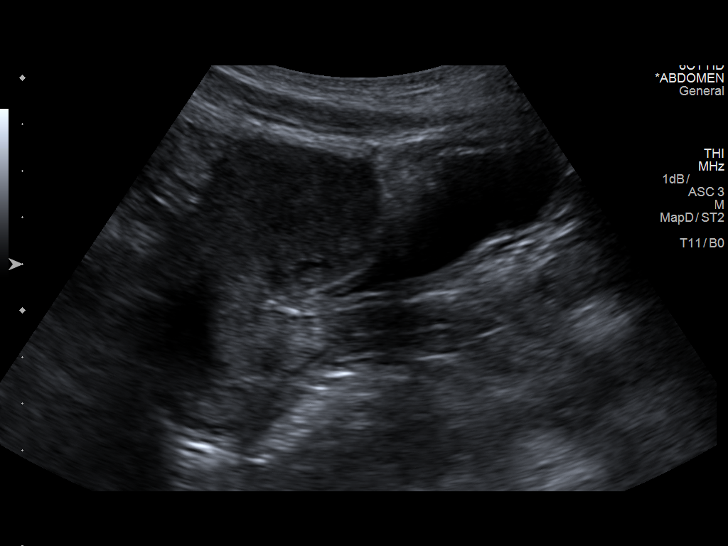
[im 27/36]
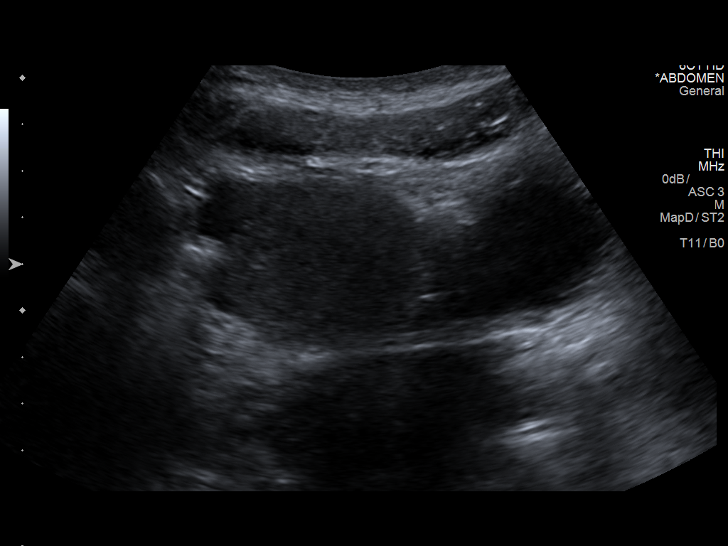
[im 30/36]
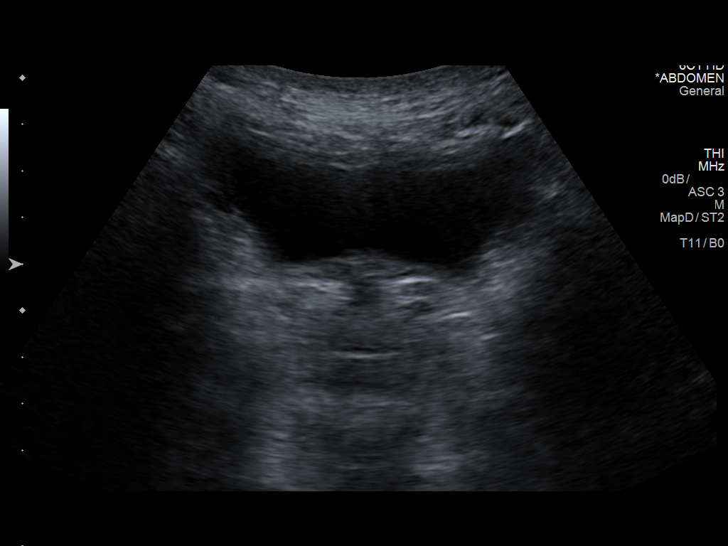
[im 33/36]
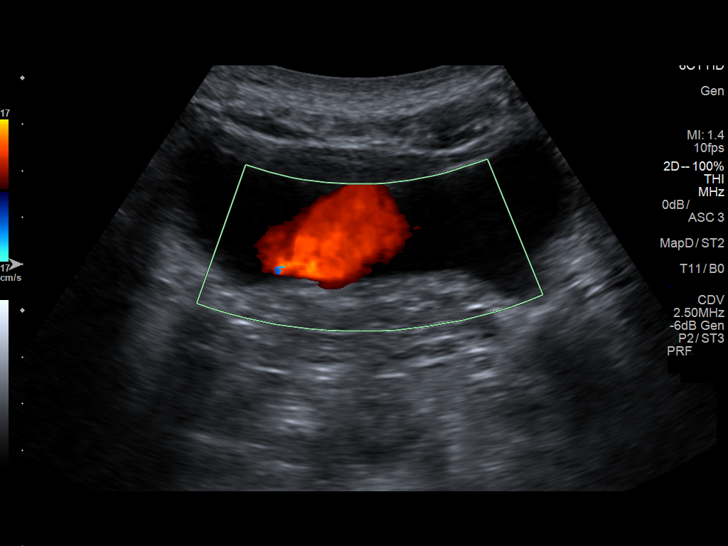
[im 36/36]
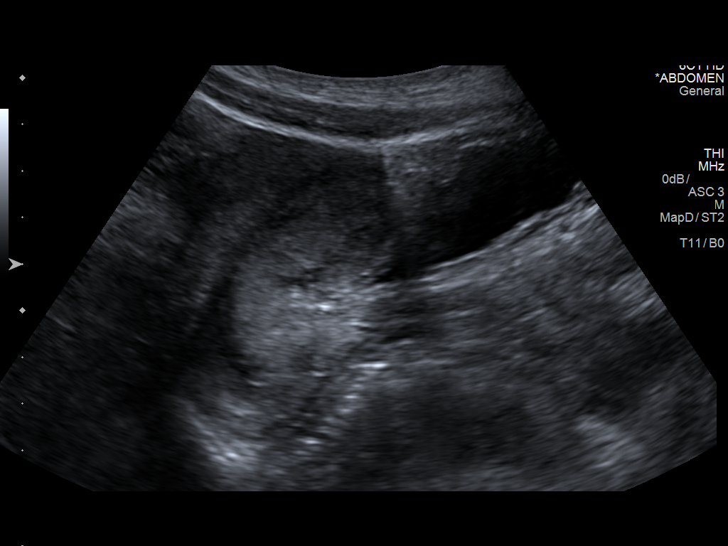

[14 of 25 positions shown; findings below may reference images not displayed]

FINDINGS: Right Kidney:

Length: 12.5 cm. Echogenicity within normal limits. No mass or
hydronephrosis visualized.

Left Kidney:

Length: 11.6 cm. Echogenicity within normal limits. No mass or
hydronephrosis visualized.

Bladder:

Partially decompressed. Appears normal for degree of bladder
distention. Both distal ureters are shown to be patent at the level
the bladder (bilateral ureteral jets visualized).
IMPRESSION: Normal renal ultrasound.  No hydronephrosis bilaterally.

## 2017-11-02 DIAGNOSIS — Z113 Encounter for screening for infections with a predominantly sexual mode of transmission: Secondary | ICD-10-CM | POA: Diagnosis not present

## 2017-11-02 DIAGNOSIS — Z3401 Encounter for supervision of normal first pregnancy, first trimester: Secondary | ICD-10-CM | POA: Diagnosis not present

## 2017-11-02 DIAGNOSIS — Z8639 Personal history of other endocrine, nutritional and metabolic disease: Secondary | ICD-10-CM | POA: Diagnosis not present

## 2017-11-02 DIAGNOSIS — O09511 Supervision of elderly primigravida, first trimester: Secondary | ICD-10-CM | POA: Diagnosis not present

## 2017-11-09 DIAGNOSIS — Z3A13 13 weeks gestation of pregnancy: Secondary | ICD-10-CM | POA: Diagnosis not present

## 2017-11-09 DIAGNOSIS — Z3689 Encounter for other specified antenatal screening: Secondary | ICD-10-CM | POA: Diagnosis not present

## 2017-11-09 DIAGNOSIS — O09521 Supervision of elderly multigravida, first trimester: Secondary | ICD-10-CM | POA: Diagnosis not present

## 2017-11-09 LAB — OB RESULTS CONSOLE ABO/RH: RH Type: POSITIVE

## 2017-11-09 LAB — OB RESULTS CONSOLE ANTIBODY SCREEN: ANTIBODY SCREEN: NEGATIVE

## 2017-11-09 LAB — OB RESULTS CONSOLE GC/CHLAMYDIA
Chlamydia: NEGATIVE
Gonorrhea: NEGATIVE

## 2017-11-09 LAB — OB RESULTS CONSOLE HIV ANTIBODY (ROUTINE TESTING): HIV: NONREACTIVE

## 2017-11-09 LAB — OB RESULTS CONSOLE RUBELLA ANTIBODY, IGM: RUBELLA: IMMUNE

## 2017-11-09 LAB — OB RESULTS CONSOLE HEPATITIS B SURFACE ANTIGEN: Hepatitis B Surface Ag: NEGATIVE

## 2017-11-09 LAB — OB RESULTS CONSOLE RPR: RPR: NONREACTIVE

## 2017-11-30 DIAGNOSIS — Z361 Encounter for antenatal screening for raised alphafetoprotein level: Secondary | ICD-10-CM | POA: Diagnosis not present

## 2017-11-30 DIAGNOSIS — O09522 Supervision of elderly multigravida, second trimester: Secondary | ICD-10-CM | POA: Diagnosis not present

## 2017-11-30 DIAGNOSIS — Z3A16 16 weeks gestation of pregnancy: Secondary | ICD-10-CM | POA: Diagnosis not present

## 2017-12-21 DIAGNOSIS — O09519 Supervision of elderly primigravida, unspecified trimester: Secondary | ICD-10-CM | POA: Diagnosis not present

## 2017-12-21 DIAGNOSIS — O09522 Supervision of elderly multigravida, second trimester: Secondary | ICD-10-CM | POA: Diagnosis not present

## 2017-12-21 DIAGNOSIS — Z3A19 19 weeks gestation of pregnancy: Secondary | ICD-10-CM | POA: Diagnosis not present

## 2018-01-06 DIAGNOSIS — Z362 Encounter for other antenatal screening follow-up: Secondary | ICD-10-CM | POA: Diagnosis not present

## 2018-01-06 DIAGNOSIS — Z3A21 21 weeks gestation of pregnancy: Secondary | ICD-10-CM | POA: Diagnosis not present

## 2018-01-06 DIAGNOSIS — O09522 Supervision of elderly multigravida, second trimester: Secondary | ICD-10-CM | POA: Diagnosis not present

## 2018-02-01 DIAGNOSIS — Z3A25 25 weeks gestation of pregnancy: Secondary | ICD-10-CM | POA: Diagnosis not present

## 2018-02-01 DIAGNOSIS — O09522 Supervision of elderly multigravida, second trimester: Secondary | ICD-10-CM | POA: Diagnosis not present

## 2018-02-20 DIAGNOSIS — Z3689 Encounter for other specified antenatal screening: Secondary | ICD-10-CM | POA: Diagnosis not present

## 2018-02-20 DIAGNOSIS — Z3A28 28 weeks gestation of pregnancy: Secondary | ICD-10-CM | POA: Diagnosis not present

## 2018-02-20 DIAGNOSIS — O09523 Supervision of elderly multigravida, third trimester: Secondary | ICD-10-CM | POA: Diagnosis not present

## 2018-03-06 NOTE — Progress Notes (Signed)
Office Visit Note  Patient: Sharon Burch             Date of Birth: 05-07-1981           MRN: [id number]             PCP: Wanda PlumpPaz, Jose E, MD Referring: Wanda PlumpPaz, Jose E, MD Visit Date: 03/16/2018 Occupation: @GUAROCC @  Subjective:  Raynauds.   History of Present Illness: Sharon Burch is a 37 y.o. female Gioffre nods.  She is [redacted] weeks pregnant at this time.  She had no problems during the pregnancy.  She states last winter in January she had significant Raynauds.  She brought some pictures on her cell phone where her left middle finger was almost purple.  She states sometimes it turns white as well.  This spring and summer has been good for her.  She denies any joint pain or joint swelling.  Activities of Daily Living:  Patient reports morning stiffness for 0 minute.   Patient Denies nocturnal pain.  Difficulty dressing/grooming: Denies Difficulty climbing stairs: Denies Difficulty getting out of chair: Denies Difficulty using hands for taps, buttons, cutlery, and/or writing: Denies  Review of Systems  Constitutional: Negative for fatigue, night sweats, weight gain and weight loss.  HENT: Negative for mouth sores, trouble swallowing, trouble swallowing, mouth dryness and nose dryness.   Eyes: Negative for pain, redness, visual disturbance and dryness.  Respiratory: Negative for cough, shortness of breath and difficulty breathing.   Cardiovascular: Negative for chest pain, palpitations, hypertension, irregular heartbeat and swelling in legs/feet.  Gastrointestinal: Negative for blood in stool, constipation and diarrhea.  Endocrine: Negative for increased urination.  Genitourinary: Negative for vaginal dryness.  Musculoskeletal: Negative for arthralgias, joint pain, joint swelling, myalgias, muscle weakness, morning stiffness, muscle tenderness and myalgias.  Skin: Positive for color change. Negative for rash, hair loss, skin tightness, ulcers and sensitivity to sunlight.    Allergic/Immunologic: Negative for susceptible to infections.  Neurological: Negative for dizziness, memory loss, night sweats and weakness.  Hematological: Negative for swollen glands.  Psychiatric/Behavioral: Negative for depressed mood and sleep disturbance. The patient is not nervous/anxious.     PMFS History:  Patient Active Problem List   Diagnosis Date Noted  . 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 PCP NOTES >>>>>>>>>>>>>>>>>> 11/25/2016  . Annual physical exam 11/24/2016  . Raynaud disease 11/24/2016  . Hypothyroidism 07/19/2005    Past Medical History:  Diagnosis Date  . Allergy   . Anemia    hx of  . Hypothyroidism 2007   treated x 4 years, then apparently TSH normalized    Family History  Problem Relation Age of Onset  . Diabetes Maternal Grandmother   . Skin cancer Paternal Grandfather   . Healthy Mother   . Diabetes Mother        pre-diabetic   . Healthy Father   . Healthy Sister   . Healthy Sister   . Healthy Sister   . Heart failure Neg Hx   . Colon cancer Neg Hx   . Breast cancer Neg Hx    Past Surgical History:  Procedure Laterality Date  . VULVA SURGERY     as child   Social History   Social History Narrative   G0P0    Objective: Vital Signs: BP 107/81 (BP Location: Left Arm, Patient Position: Sitting, Cuff Size: Normal)   Pulse (!) 101   Resp 14   Ht 5\' 6"  (1.676 m)   Wt 163 lb  9.6 oz (74.2 kg)   LMP 03/22/2017 (Exact Date)   BMI 26.41 kg/m    Physical Exam  Constitutional: She is oriented to person, place, and time. She appears well-developed and well-nourished.  HENT:  Head: Normocephalic and atraumatic.  Eyes: Conjunctivae and EOM are normal.  Neck: Normal range of motion.  Cardiovascular: Normal rate, regular rhythm, normal heart sounds and intact distal pulses.  Pulmonary/Chest: Effort normal and breath sounds normal.  Abdominal: Soft. Bowel sounds are normal.  Gestation 31 weeks   Lymphadenopathy:    She has no cervical adenopathy.  Neurological: She is alert and oriented to person, place, and time.  Skin: Skin is warm and dry. Capillary refill takes less than 2 seconds.  She had good capillary refill.  Libido reticularis was noted.  Psychiatric: She has a normal mood and affect. Her behavior is normal.  Nursing note and vitals reviewed.    Musculoskeletal Exam: C-spine thoracic lumbar spine good range of motion.  Shoulder joints elbow joints wrist joint MCPs PIPs DIPs were in good range of motion.  Hip joints, knee joints, ankles, MTPs PIPs DIPs were in good range of motion with no synovitis.  CDAI Exam: CDAI Score: Not documented Patient Global Assessment: Not documented; Provider Global Assessment: Not documented Swollen: Not documented; Tender: Not documented Joint Exam   Not documented   There is currently no information documented on the homunculus. Go to the Rheumatology activity and complete the homunculus joint exam.  Investigation: No additional findings.  Imaging: No results found.  Recent Labs: Lab Results  Component Value Date   WBC 6.9 02/09/2017   HGB 14.3 02/09/2017   PLT 311 02/09/2017   NA 139 11/24/2016   K 4.0 11/24/2016   CL 103 11/24/2016   CO2 29 11/24/2016   GLUCOSE 92 11/24/2016   BUN 14 11/24/2016   CREATININE 0.69 11/24/2016   BILITOT 0.3 11/24/2016   ALKPHOS 37 (L) 11/24/2016   AST 18 11/24/2016   ALT 16 11/24/2016   PROT 7.9 11/24/2016   ALBUMIN 5.0 11/24/2016   CALCIUM 9.9 11/24/2016    Speciality Comments: No specialty comments available.  Procedures:  No procedures performed Allergies: Penicillins   Assessment / Plan:     Visit Diagnoses: Raynaud's disease without gangrene-severe - All autoimmune workup negative in the past.  He still continues to get severe Raynauds during the wintertime.  I will repeat her labs next year.- Plan: ANA, Anti-scleroderma antibody, RNP Antibody, Anti-Smith antibody,  Sjogrens syndrome-A extractable nuclear antibody, Sjogrens syndrome-B extractable nuclear antibody, Anti-DNA antibody, double-stranded, C3 and C4, Beta-2 glycoprotein antibodies, Cardiolipin antibodies, IgG, IgM, IgA, Lupus Anticoagulant Eval w/Reflex  Chondromalacia of patella, bilateral-she has some discomfort which is tolerable.  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  [redacted] weeks gestation of pregnancy-her pregnancy is going well.  Other fatigue -with her next labs.  Plan: CBC with Differential/Platelet, COMPLETE METABOLIC PANEL WITH GFR, Urinalysis, Routine w reflex microscopic   Orders: Orders Placed This Encounter  Procedures  . CBC with Differential/Platelet  . COMPLETE METABOLIC PANEL WITH GFR  . Urinalysis, Routine w reflex microscopic  . ANA  . Anti-scleroderma antibody  . RNP Antibody  . Anti-Smith antibody  . Sjogrens syndrome-A extractable nuclear antibody  . Sjogrens syndrome-B extractable nuclear antibody  . Anti-DNA antibody, double-stranded  . C3 and C4  . Beta-2 glycoprotein antibodies  . Cardiolipin antibodies, IgG, IgM, IgA  . Lupus Anticoagulant Eval w/Reflex   No orders of  the defined types were placed in this encounter.     Follow-Up Instructions: No follow-ups on file.   Pollyann SavoyShaili Kealan Buchan, MD  Note - This record has been created using Animal nutritionistDragon software.  Chart creation errors have been sought, but may not always  have been located. Such creation errors do not reflect on  the standard of medical care.

## 2018-03-07 DIAGNOSIS — Z3A30 30 weeks gestation of pregnancy: Secondary | ICD-10-CM | POA: Diagnosis not present

## 2018-03-07 DIAGNOSIS — Z23 Encounter for immunization: Secondary | ICD-10-CM | POA: Diagnosis not present

## 2018-03-07 DIAGNOSIS — O09523 Supervision of elderly multigravida, third trimester: Secondary | ICD-10-CM | POA: Diagnosis not present

## 2018-03-16 ENCOUNTER — Encounter: Payer: Self-pay | Admitting: Rheumatology

## 2018-03-16 ENCOUNTER — Ambulatory Visit: Payer: BLUE CROSS/BLUE SHIELD | Admitting: Rheumatology

## 2018-03-16 VITALS — BP 107/81 | HR 101 | Resp 14 | Ht 66.0 in | Wt 163.6 lb

## 2018-03-16 DIAGNOSIS — M2240 Chondromalacia patellae, unspecified knee: Secondary | ICD-10-CM

## 2018-03-16 DIAGNOSIS — R231 Pallor: Secondary | ICD-10-CM

## 2018-03-16 DIAGNOSIS — I7300 Raynaud's syndrome without gangrene: Secondary | ICD-10-CM | POA: Diagnosis not present

## 2018-03-16 DIAGNOSIS — Z3A31 31 weeks gestation of pregnancy: Secondary | ICD-10-CM | POA: Diagnosis not present

## 2018-03-16 DIAGNOSIS — R5383 Other fatigue: Secondary | ICD-10-CM

## 2018-03-23 DIAGNOSIS — O09523 Supervision of elderly multigravida, third trimester: Secondary | ICD-10-CM | POA: Diagnosis not present

## 2018-03-23 DIAGNOSIS — Z3A32 32 weeks gestation of pregnancy: Secondary | ICD-10-CM | POA: Diagnosis not present

## 2018-04-03 DIAGNOSIS — Z3A34 34 weeks gestation of pregnancy: Secondary | ICD-10-CM | POA: Diagnosis not present

## 2018-04-03 DIAGNOSIS — O09523 Supervision of elderly multigravida, third trimester: Secondary | ICD-10-CM | POA: Diagnosis not present

## 2018-04-13 DIAGNOSIS — Z3685 Encounter for antenatal screening for Streptococcus B: Secondary | ICD-10-CM | POA: Diagnosis not present

## 2018-04-13 DIAGNOSIS — O09523 Supervision of elderly multigravida, third trimester: Secondary | ICD-10-CM | POA: Diagnosis not present

## 2018-04-13 LAB — OB RESULTS CONSOLE GBS: GBS: NEGATIVE

## 2018-04-19 DIAGNOSIS — O09523 Supervision of elderly multigravida, third trimester: Secondary | ICD-10-CM | POA: Diagnosis not present

## 2018-04-19 DIAGNOSIS — Z3A36 36 weeks gestation of pregnancy: Secondary | ICD-10-CM | POA: Diagnosis not present

## 2018-04-24 DIAGNOSIS — Z3A37 37 weeks gestation of pregnancy: Secondary | ICD-10-CM | POA: Diagnosis not present

## 2018-04-24 DIAGNOSIS — O09523 Supervision of elderly multigravida, third trimester: Secondary | ICD-10-CM | POA: Diagnosis not present

## 2018-05-02 DIAGNOSIS — Z3A38 38 weeks gestation of pregnancy: Secondary | ICD-10-CM | POA: Diagnosis not present

## 2018-05-02 DIAGNOSIS — O09523 Supervision of elderly multigravida, third trimester: Secondary | ICD-10-CM | POA: Diagnosis not present

## 2018-05-08 DIAGNOSIS — Z3A39 39 weeks gestation of pregnancy: Secondary | ICD-10-CM | POA: Diagnosis not present

## 2018-05-08 DIAGNOSIS — O09523 Supervision of elderly multigravida, third trimester: Secondary | ICD-10-CM | POA: Diagnosis not present

## 2018-05-16 DIAGNOSIS — O480 Post-term pregnancy: Secondary | ICD-10-CM | POA: Diagnosis not present

## 2018-05-16 DIAGNOSIS — O09523 Supervision of elderly multigravida, third trimester: Secondary | ICD-10-CM | POA: Diagnosis not present

## 2018-05-16 DIAGNOSIS — Z3A40 40 weeks gestation of pregnancy: Secondary | ICD-10-CM | POA: Diagnosis not present

## 2018-05-19 ENCOUNTER — Inpatient Hospital Stay (HOSPITAL_COMMUNITY)
Admission: AD | Admit: 2018-05-19 | Discharge: 2018-05-19 | Disposition: A | Discharge status: Home / Self Care | Payer: BLUE CROSS/BLUE SHIELD | Source: Ambulatory Visit | Attending: Obstetrics and Gynecology | Admitting: Obstetrics and Gynecology

## 2018-05-19 ENCOUNTER — Encounter (HOSPITAL_COMMUNITY): Payer: Self-pay | Admitting: *Deleted

## 2018-05-19 DIAGNOSIS — O09529 Supervision of elderly multigravida, unspecified trimester: Secondary | ICD-10-CM | POA: Diagnosis not present

## 2018-05-19 DIAGNOSIS — Z3A00 Weeks of gestation of pregnancy not specified: Secondary | ICD-10-CM | POA: Diagnosis not present

## 2018-05-19 DIAGNOSIS — O479 False labor, unspecified: Secondary | ICD-10-CM | POA: Diagnosis not present

## 2018-05-19 NOTE — Discharge Instructions (Signed)
Braxton Hicks Contractions °Contractions of the uterus can occur throughout pregnancy, but they are not always a sign that you are in labor. You may have practice contractions called Braxton Hicks contractions. These false labor contractions are sometimes confused with true labor. °What are Braxton Hicks contractions? °Braxton Hicks contractions are tightening movements that occur in the muscles of the uterus before labor. Unlike true labor contractions, these contractions do not result in opening (dilation) and thinning of the cervix. Toward the end of pregnancy (32-34 weeks), Braxton Hicks contractions can happen more often and may become stronger. These contractions are sometimes difficult to tell apart from true labor because they can be very uncomfortable. You should not feel embarrassed if you go to the hospital with false labor. °Sometimes, the only way to tell if you are in true labor is for your health care provider to look for changes in the cervix. The health care provider will do a physical exam and may monitor your contractions. If you are not in true labor, the exam should show that your cervix is not dilating and your water has not broken. °If there are other health problems associated with your pregnancy, it is completely safe for you to be sent home with false labor. You may continue to have Braxton Hicks contractions until you go into true labor. °How to tell the difference between true labor and false labor °True labor °· Contractions last 30-70 seconds. °· Contractions become very regular. °· Discomfort is usually felt in the top of the uterus, and it spreads to the lower abdomen and low back. °· Contractions do not go away with walking. °· Contractions usually become more intense and increase in frequency. °· The cervix dilates and gets thinner. °False labor °· Contractions are usually shorter and not as strong as true labor contractions. °· Contractions are usually irregular. °· Contractions  are often felt in the front of the lower abdomen and in the groin. °· Contractions may go away when you walk around or change positions while lying down. °· Contractions get weaker and are shorter-lasting as time goes on. °· The cervix usually does not dilate or become thin. °Follow these instructions at home: °· Take over-the-counter and prescription medicines only as told by your health care provider. °· Keep up with your usual exercises and follow other instructions from your health care provider. °· Eat and drink lightly if you think you are going into labor. °· If Braxton Hicks contractions are making you uncomfortable: °? Change your position from lying down or resting to walking, or change from walking to resting. °? Sit and rest in a tub of warm water. °? Drink enough fluid to keep your urine pale yellow. Dehydration may cause these contractions. °? Do slow and deep breathing several times an hour. °· Keep all follow-up prenatal visits as told by your health care provider. This is important. °Contact a health care provider if: °· You have a fever. °· You have continuous pain in your abdomen. °Get help right away if: °· Your contractions become stronger, more regular, and closer together. °· You have fluid leaking or gushing from your vagina. °· You pass blood-tinged mucus (bloody show). °· You have bleeding from your vagina. °· You have low back pain that you never had before. °· You feel your baby’s head pushing down and causing pelvic pressure. °· Your baby is not moving inside you as much as it used to. °Summary °· Contractions that occur before labor are called Braxton   Hicks contractions, false labor, or practice contractions. °· Braxton Hicks contractions are usually shorter, weaker, farther apart, and less regular than true labor contractions. True labor contractions usually become progressively stronger and regular and they become more frequent. °· Manage discomfort from Braxton Hicks contractions by  changing position, resting in a warm bath, drinking plenty of water, or practicing deep breathing. °This information is not intended to replace advice given to you by your health care provider. Make sure you discuss any questions you have with your health care provider. °Document Released: 11/18/2016 Document Revised: 11/18/2016 Document Reviewed: 11/18/2016 °Elsevier Interactive Patient Education © 2018 [employer] ° °

## 2018-05-19 NOTE — MAU Note (Signed)
Pt C/O contractions for the last 2 hours, averaging every 5 minutes.  Having some mucus discharge, denies bleeding or LOF.  Reports good fetal movement. 2 cm's on Tuesday in MD office.

## 2018-05-22 DIAGNOSIS — Z3A41 41 weeks gestation of pregnancy: Secondary | ICD-10-CM | POA: Diagnosis not present

## 2018-05-22 DIAGNOSIS — O480 Post-term pregnancy: Secondary | ICD-10-CM | POA: Diagnosis not present

## 2018-05-23 ENCOUNTER — Other Ambulatory Visit: Payer: Self-pay | Admitting: Obstetrics and Gynecology

## 2018-05-24 ENCOUNTER — Telehealth (HOSPITAL_COMMUNITY): Payer: Self-pay | Admitting: *Deleted

## 2018-05-24 ENCOUNTER — Inpatient Hospital Stay (HOSPITAL_COMMUNITY)
Admission: AD | Admit: 2018-05-24 | Discharge: 2018-05-24 | Disposition: A | Discharge status: Home / Self Care | Payer: BLUE CROSS/BLUE SHIELD | Source: Ambulatory Visit | Attending: Obstetrics | Admitting: Obstetrics

## 2018-05-24 ENCOUNTER — Encounter (HOSPITAL_COMMUNITY): Payer: Self-pay

## 2018-05-24 DIAGNOSIS — Z3A41 41 weeks gestation of pregnancy: Secondary | ICD-10-CM | POA: Insufficient documentation

## 2018-05-24 DIAGNOSIS — O471 False labor at or after 37 completed weeks of gestation: Secondary | ICD-10-CM | POA: Insufficient documentation

## 2018-05-24 DIAGNOSIS — O479 False labor, unspecified: Secondary | ICD-10-CM

## 2018-05-24 NOTE — MAU Note (Signed)
Contractions for 2 hours, q 5 minutes. Pressure in pelvis

## 2018-05-24 NOTE — Telephone Encounter (Signed)
Preadmission screen  

## 2018-05-24 NOTE — MAU Note (Signed)
Dr Billy Coast notified of pt arrival , uc's q 10-15 minutes, SVE 2/50, reactive tracing. Pt had stated she is scheduled for induction tomorrow night for postdates (41.3), and that when she was here last week dr Billy Coast stated he would have kept her if he had known she was here. All reported to dr Billy Coast. Will recheck pt in one hour.

## 2018-05-24 NOTE — Progress Notes (Signed)
NST:  Baseline: 135 bpm, Variability: Good {> 6 bpm), Accelerations: Reactive and Decelerations: Absent   RN labor eval. RN contacted Dr. Billy Coast regarding patient. Per Dr. Billy Coast, patient to be discharged but he is unable to enter discharge orders.  NST reviewed & found to be reactive.   Judeth Horn, NP

## 2018-05-25 ENCOUNTER — Inpatient Hospital Stay (HOSPITAL_COMMUNITY): Admission: RE | Admit: 2018-05-25 | Payer: BLUE CROSS/BLUE SHIELD | Source: Ambulatory Visit

## 2018-05-26 ENCOUNTER — Encounter (HOSPITAL_COMMUNITY)
Admission: RE | Disposition: A | Discharge status: Home / Self Care | Payer: Self-pay | Source: Home / Self Care | Attending: Obstetrics and Gynecology

## 2018-05-26 ENCOUNTER — Inpatient Hospital Stay (HOSPITAL_COMMUNITY): Payer: BLUE CROSS/BLUE SHIELD | Admitting: Anesthesiology

## 2018-05-26 ENCOUNTER — Inpatient Hospital Stay (HOSPITAL_COMMUNITY)
Admission: RE | Admit: 2018-05-26 | Discharge: 2018-05-30 | DRG: 787 | Disposition: A | Discharge status: Home / Self Care | Payer: BLUE CROSS/BLUE SHIELD | Attending: Obstetrics and Gynecology | Admitting: Obstetrics and Gynecology

## 2018-05-26 ENCOUNTER — Encounter (HOSPITAL_COMMUNITY): Payer: Self-pay

## 2018-05-26 DIAGNOSIS — O9081 Anemia of the puerperium: Secondary | ICD-10-CM | POA: Diagnosis not present

## 2018-05-26 DIAGNOSIS — D62 Acute posthemorrhagic anemia: Secondary | ICD-10-CM | POA: Diagnosis not present

## 2018-05-26 DIAGNOSIS — Z87891 Personal history of nicotine dependence: Secondary | ICD-10-CM

## 2018-05-26 DIAGNOSIS — O480 Post-term pregnancy: Secondary | ICD-10-CM | POA: Diagnosis not present

## 2018-05-26 DIAGNOSIS — O324XX0 Maternal care for high head at term, not applicable or unspecified: Secondary | ICD-10-CM | POA: Diagnosis present

## 2018-05-26 DIAGNOSIS — Z3A41 41 weeks gestation of pregnancy: Secondary | ICD-10-CM

## 2018-05-26 DIAGNOSIS — Z3490 Encounter for supervision of normal pregnancy, unspecified, unspecified trimester: Secondary | ICD-10-CM | POA: Diagnosis present

## 2018-05-26 DIAGNOSIS — Z880 Allergy status to penicillin: Secondary | ICD-10-CM | POA: Diagnosis not present

## 2018-05-26 LAB — CBC
HCT: 34.2 % — ABNORMAL LOW (ref 36.0–46.0)
Hemoglobin: 11.4 g/dL — ABNORMAL LOW (ref 12.0–15.0)
MCH: 31.1 pg (ref 26.0–34.0)
MCHC: 33.3 g/dL (ref 30.0–36.0)
MCV: 93.4 fL (ref 80.0–100.0)
Platelets: 252 10*3/uL (ref 150–400)
RBC: 3.66 MIL/uL — ABNORMAL LOW (ref 3.87–5.11)
RDW: 13.8 % (ref 11.5–15.5)
WBC: 8.6 10*3/uL (ref 4.0–10.5)

## 2018-05-26 LAB — TYPE AND SCREEN
ABO/RH(D): A POS
Antibody Screen: NEGATIVE

## 2018-05-26 LAB — SYPHILIS: RPR W/REFLEX TO RPR TITER AND TREPONEMAL ANTIBODIES, TRADITIONAL SCREENING AND DIAGNOSIS ALGORITHM: RPR Ser Ql: NONREACTIVE

## 2018-05-26 LAB — ABO/RH: ABO/RH(D): A POS

## 2018-05-26 SURGERY — Surgical Case
Anesthesia: Epidural

## 2018-05-26 MED ORDER — TERBUTALINE SULFATE 1 MG/ML IJ SOLN: 0.2500 mg | Freq: Once | INTRAMUSCULAR | Status: DC | PRN

## 2018-05-26 MED ORDER — ACETAMINOPHEN 10 MG/ML IV SOLN
INTRAVENOUS | Status: AC
  Filled 2018-05-26: qty 100

## 2018-05-26 MED ORDER — MORPHINE SULFATE (PF) 0.5 MG/ML IJ SOLN
INTRAMUSCULAR | Status: AC
  Filled 2018-05-26: qty 10

## 2018-05-26 MED ORDER — LIDOCAINE HCL (PF) 1 % IJ SOLN
INTRAMUSCULAR | Status: DC | PRN
  Administered 2018-05-26 (×2): 4 mL via EPIDURAL

## 2018-05-26 MED ORDER — LACTATED RINGERS IV SOLN
500.0000 mL | INTRAVENOUS | Status: DC | PRN
  Administered 2018-05-26: 500 mL via INTRAVENOUS

## 2018-05-26 MED ORDER — GENTAMICIN SULFATE 40 MG/ML IJ SOLN
5.0000 mg/kg | Freq: Once | INTRAVENOUS | Status: AC
  Administered 2018-05-27: 330 mg via INTRAVENOUS
  Filled 2018-05-26: qty 8.25

## 2018-05-26 MED ORDER — KETOROLAC TROMETHAMINE 30 MG/ML IJ SOLN: 30.0000 mg | Freq: Once | INTRAMUSCULAR | Status: AC | PRN

## 2018-05-26 MED ORDER — DEXAMETHASONE SODIUM PHOSPHATE 10 MG/ML IJ SOLN
INTRAMUSCULAR | Status: DC | PRN
  Administered 2018-05-26: 5 mg via INTRAVENOUS

## 2018-05-26 MED ORDER — PHENYLEPHRINE HCL 10 MG/ML IJ SOLN
INTRAMUSCULAR | Status: DC | PRN
  Administered 2018-05-26 (×3): 80 ug via INTRAVENOUS

## 2018-05-26 MED ORDER — PHENYLEPHRINE 40 MCG/ML (10ML) SYRINGE FOR IV PUSH (FOR BLOOD PRESSURE SUPPORT): 80.0000 ug | PREFILLED_SYRINGE | INTRAVENOUS | Status: DC | PRN

## 2018-05-26 MED ORDER — ACETAMINOPHEN 10 MG/ML IV SOLN
INTRAVENOUS | Status: DC | PRN
  Administered 2018-05-26: 1000 mg via INTRAVENOUS

## 2018-05-26 MED ORDER — LACTATED RINGERS IV SOLN
INTRAVENOUS | Status: DC
  Administered 2018-05-26: 125 mL via INTRAVENOUS
  Administered 2018-05-26 (×3): via INTRAVENOUS

## 2018-05-26 MED ORDER — ONDANSETRON HCL 4 MG/2ML IJ SOLN
INTRAMUSCULAR | Status: DC | PRN
  Administered 2018-05-26: 4 mg via INTRAVENOUS

## 2018-05-26 MED ORDER — FENTANYL CITRATE (PF) 100 MCG/2ML IJ SOLN
INTRAMUSCULAR | Status: DC | PRN
  Administered 2018-05-26: 100 ug via EPIDURAL

## 2018-05-26 MED ORDER — DEXAMETHASONE SODIUM PHOSPHATE 10 MG/ML IJ SOLN
INTRAMUSCULAR | Status: AC
  Filled 2018-05-26: qty 1

## 2018-05-26 MED ORDER — OXYTOCIN 10 UNIT/ML IJ SOLN
INTRAMUSCULAR | Status: AC
  Filled 2018-05-26: qty 4

## 2018-05-26 MED ORDER — LIDOCAINE-EPINEPHRINE (PF) 2 %-1:200000 IJ SOLN
INTRAMUSCULAR | Status: DC | PRN
  Administered 2018-05-26 (×2): 5 mL via EPIDURAL

## 2018-05-26 MED ORDER — ONDANSETRON HCL 4 MG/2ML IJ SOLN
INTRAMUSCULAR | Status: AC
  Filled 2018-05-26: qty 2

## 2018-05-26 MED ORDER — OXYTOCIN 10 UNIT/ML IJ SOLN
INTRAMUSCULAR | Status: AC
  Filled 2018-05-26: qty 9

## 2018-05-26 MED ORDER — METHYLERGONOVINE MALEATE 0.2 MG/ML IJ SOLN
INTRAMUSCULAR | Status: DC | PRN
  Administered 2018-05-26: 0.2 mg via INTRAMUSCULAR

## 2018-05-26 MED ORDER — ACETAMINOPHEN 325 MG PO TABS: 650.0000 mg | ORAL_TABLET | ORAL | Status: DC | PRN

## 2018-05-26 MED ORDER — PHENYLEPHRINE 40 MCG/ML (10ML) SYRINGE FOR IV PUSH (FOR BLOOD PRESSURE SUPPORT)
PREFILLED_SYRINGE | INTRAVENOUS | Status: AC
  Filled 2018-05-26: qty 30

## 2018-05-26 MED ORDER — PROMETHAZINE HCL 25 MG/ML IJ SOLN: 6.2500 mg | INTRAMUSCULAR | Status: DC | PRN

## 2018-05-26 MED ORDER — FENTANYL 2.5 MCG/ML BUPIVACAINE 1/10 % EPIDURAL INFUSION (WH - ANES)
INTRAMUSCULAR | Status: AC
  Filled 2018-05-26: qty 100

## 2018-05-26 MED ORDER — FENTANYL CITRATE (PF) 100 MCG/2ML IJ SOLN
INTRAMUSCULAR | Status: AC
  Filled 2018-05-26: qty 2

## 2018-05-26 MED ORDER — SOD CITRATE-CITRIC ACID 500-334 MG/5ML PO SOLN
30.0000 mL | ORAL | Status: DC | PRN
  Administered 2018-05-26: 30 mL via ORAL

## 2018-05-26 MED ORDER — FENTANYL CITRATE (PF) 100 MCG/2ML IJ SOLN
25.0000 ug | INTRAMUSCULAR | Status: DC | PRN
  Administered 2018-05-26: 50 ug via INTRAVENOUS

## 2018-05-26 MED ORDER — FENTANYL 2.5 MCG/ML BUPIVACAINE 1/10 % EPIDURAL INFUSION (WH - ANES)
14.0000 mL/h | INTRAMUSCULAR | Status: DC | PRN
  Administered 2018-05-26 (×2): 14 mL/h via EPIDURAL
  Filled 2018-05-26: qty 100

## 2018-05-26 MED ORDER — SODIUM CHLORIDE 0.9 % IR SOLN
Status: DC | PRN
  Administered 2018-05-26: 1000 mL

## 2018-05-26 MED ORDER — CEFAZOLIN SODIUM-DEXTROSE 2-3 GM-%(50ML) IV SOLR
INTRAVENOUS | Status: DC | PRN
  Administered 2018-05-26: 2 g via INTRAVENOUS

## 2018-05-26 MED ORDER — MISOPROSTOL 25 MCG QUARTER TABLET
25.0000 ug | ORAL_TABLET | ORAL | Status: DC | PRN
  Administered 2018-05-26 (×2): 25 ug via VAGINAL
  Filled 2018-05-26 (×2): qty 1

## 2018-05-26 MED ORDER — OXYTOCIN 10 UNIT/ML IJ SOLN
INTRAVENOUS | Status: DC | PRN
  Administered 2018-05-26: 40 [IU] via INTRAVENOUS

## 2018-05-26 MED ORDER — LACTATED RINGERS IV SOLN
INTRAVENOUS | Status: DC | PRN
  Administered 2018-05-26: 21:00:00 via INTRAVENOUS

## 2018-05-26 MED ORDER — LIDOCAINE HCL (PF) 1 % IJ SOLN
30.0000 mL | INTRAMUSCULAR | Status: DC | PRN
  Filled 2018-05-26: qty 30

## 2018-05-26 MED ORDER — OXYTOCIN 40 UNITS IN LACTATED RINGERS INFUSION - SIMPLE MED
2.5000 [IU]/h | INTRAVENOUS | Status: DC
  Filled 2018-05-26: qty 1000

## 2018-05-26 MED ORDER — MORPHINE SULFATE (PF) 0.5 MG/ML IJ SOLN
INTRAMUSCULAR | Status: DC | PRN
  Administered 2018-05-26: 2 mg via EPIDURAL

## 2018-05-26 MED ORDER — BUPIVACAINE HCL (PF) 0.25 % IJ SOLN
INTRAMUSCULAR | Status: AC
  Filled 2018-05-26: qty 10

## 2018-05-26 MED ORDER — OXYTOCIN 40 UNITS IN LACTATED RINGERS INFUSION - SIMPLE MED
1.0000 m[IU]/min | INTRAVENOUS | Status: DC
  Administered 2018-05-26: 2 m[IU]/min via INTRAVENOUS

## 2018-05-26 MED ORDER — CEFAZOLIN SODIUM-DEXTROSE 2-4 GM/100ML-% IV SOLN
INTRAVENOUS | Status: AC
  Filled 2018-05-26: qty 100

## 2018-05-26 MED ORDER — EPHEDRINE 5 MG/ML INJ: 10.0000 mg | INTRAVENOUS | Status: DC | PRN

## 2018-05-26 MED ORDER — CLINDAMYCIN PHOSPHATE 900 MG/50ML IV SOLN
900.0000 mg | Freq: Three times a day (TID) | INTRAVENOUS | Status: DC
  Administered 2018-05-26 – 2018-05-28 (×4): 900 mg via INTRAVENOUS
  Filled 2018-05-26 (×6): qty 50

## 2018-05-26 MED ORDER — ONDANSETRON HCL 4 MG/2ML IJ SOLN
4.0000 mg | Freq: Four times a day (QID) | INTRAMUSCULAR | Status: DC | PRN
  Administered 2018-05-26: 4 mg via INTRAVENOUS
  Filled 2018-05-26: qty 2

## 2018-05-26 MED ORDER — SODIUM CHLORIDE 0.9 % IR SOLN
Status: DC | PRN
  Administered 2018-05-26: 1

## 2018-05-26 MED ORDER — DIPHENHYDRAMINE HCL 50 MG/ML IJ SOLN: 12.5000 mg | INTRAMUSCULAR | Status: DC | PRN

## 2018-05-26 MED ORDER — LACTATED RINGERS IV SOLN
500.0000 mL | Freq: Once | INTRAVENOUS | Status: AC
  Administered 2018-05-26: 500 mL via INTRAVENOUS

## 2018-05-26 MED ORDER — METHYLERGONOVINE MALEATE 0.2 MG/ML IJ SOLN
INTRAMUSCULAR | Status: AC
  Filled 2018-05-26: qty 1

## 2018-05-26 MED ORDER — DEXAMETHASONE SODIUM PHOSPHATE 10 MG/ML IJ SOLN
INTRAMUSCULAR | Status: AC
  Filled 2018-05-26: qty 2

## 2018-05-26 MED ORDER — PHENYLEPHRINE 40 MCG/ML (10ML) SYRINGE FOR IV PUSH (FOR BLOOD PRESSURE SUPPORT)
PREFILLED_SYRINGE | INTRAVENOUS | Status: AC
  Filled 2018-05-26: qty 10

## 2018-05-26 MED ORDER — KETOROLAC TROMETHAMINE 30 MG/ML IJ SOLN
INTRAMUSCULAR | Status: AC
  Administered 2018-05-27: 30 mg
  Filled 2018-05-26: qty 1

## 2018-05-26 MED ORDER — OXYTOCIN BOLUS FROM INFUSION: 500.0000 mL | Freq: Once | INTRAVENOUS | Status: DC

## 2018-05-26 MED ORDER — BUPIVACAINE HCL (PF) 0.25 % IJ SOLN
INTRAMUSCULAR | Status: DC | PRN
  Administered 2018-05-26: 20 mL

## 2018-05-26 MED ORDER — LACTATED RINGERS IV SOLN: 500.0000 mL | Freq: Once | INTRAVENOUS | Status: DC

## 2018-05-26 SURGICAL SUPPLY — 34 items
CHLORAPREP W/TINT 26ML (MISCELLANEOUS) ×2 IMPLANT
CLAMP CORD UMBIL (MISCELLANEOUS) IMPLANT
CLOTH BEACON ORANGE TIMEOUT ST (SAFETY) ×2 IMPLANT
DERMABOND ADHESIVE PROPEN (GAUZE/BANDAGES/DRESSINGS) ×1
DERMABOND ADVANCED .7 DNX6 (GAUZE/BANDAGES/DRESSINGS) ×1 IMPLANT
DRSG OPSITE POSTOP 4X10 (GAUZE/BANDAGES/DRESSINGS) ×2 IMPLANT
ELECT REM PT RETURN 9FT ADLT (ELECTROSURGICAL) ×2
ELECTRODE REM PT RTRN 9FT ADLT (ELECTROSURGICAL) ×1 IMPLANT
EXTRACTOR VACUUM M CUP 4 TUBE (SUCTIONS) IMPLANT
GLOVE BIO SURGEON STRL SZ7.5 (GLOVE) ×2 IMPLANT
GLOVE BIOGEL PI IND STRL 7.0 (GLOVE) ×1 IMPLANT
GLOVE BIOGEL PI INDICATOR 7.0 (GLOVE) ×1
GOWN STRL REUS W/TWL LRG LVL3 (GOWN DISPOSABLE) ×4 IMPLANT
KIT ABG SYR 3ML LUER SLIP (SYRINGE) IMPLANT
NEEDLE HYPO 22GX1.5 SAFETY (NEEDLE) ×2 IMPLANT
NEEDLE HYPO 25X5/8 SAFETYGLIDE (NEEDLE) IMPLANT
NEEDLE SPNL 20GX3.5 QUINCKE YW (NEEDLE) IMPLANT
NS IRRIG 1000ML POUR BTL (IV SOLUTION) ×2 IMPLANT
PACK C SECTION WH (CUSTOM PROCEDURE TRAY) ×2 IMPLANT
PENCIL SMOKE EVAC W/HOLSTER (ELECTROSURGICAL) ×2 IMPLANT
SPONGE LAP 18X18 RF (DISPOSABLE) ×6 IMPLANT
SUT MNCRL 0 VIOLET CTX 36 (SUTURE) ×3 IMPLANT
SUT MNCRL AB 3-0 PS2 27 (SUTURE) IMPLANT
SUT MON AB 2-0 CT1 27 (SUTURE) ×2 IMPLANT
SUT MON AB-0 CT1 36 (SUTURE) ×4 IMPLANT
SUT MONOCRYL 0 CTX 36 (SUTURE) ×3
SUT PLAIN 0 NONE (SUTURE) IMPLANT
SUT PLAIN 2 0 (SUTURE) ×1
SUT PLAIN 2 0 XLH (SUTURE) IMPLANT
SUT PLAIN ABS 2-0 CT1 27XMFL (SUTURE) ×1 IMPLANT
SYR 20CC LL (SYRINGE) IMPLANT
SYR CONTROL 10ML LL (SYRINGE) ×2 IMPLANT
TOWEL OR 17X24 6PK STRL BLUE (TOWEL DISPOSABLE) ×2 IMPLANT
TRAY FOLEY W/BAG SLVR 14FR LF (SET/KITS/TRAYS/PACK) ×2 IMPLANT

## 2018-05-26 NOTE — Progress Notes (Signed)
Sharon Burch is a 37 y.o. G1P0 at [redacted]w[redacted]d by LMP admitted for induction of labor due to postdates.  Subjective: Pushing x 2+ hours  Objective: BP 113/63   Pulse 83   Temp 99.9 F (37.7 C) (Oral)   Resp 20   Ht 5\' 5"  (1.651 m)   Wt 79.3 kg   BMI 29.09 kg/m  No intake/output data recorded. Total I/O In: -  Out: 825 [Urine:825]  FHT:  FHR: 160s bpm, variability: moderate,  accelerations:  Abscent,  decelerations:  Present recurrent deep variables and late decels UC:   regular, every 3 minutes SVE:   Dilation: 10 Effacement (%): 100 Station: 0 Exam by:: Dr Billy Coast  Minimal descent with pushing  Labs: Lab Results  Component Value Date   WBC 8.6 05/26/2018   HGB 11.4 (L) 05/26/2018   HCT 34.2 (L) 05/26/2018   MCV 93.4 05/26/2018   PLT 252 05/26/2018    Assessment / Plan: Failure to descend Non reassuring FHR  Labor: no progress with pushing Preeclampsia:  no signs or symptoms of toxicity Fetal Wellbeing:  Category III Pain Control:  Epidural I/D:  n/a Anticipated MOD:  Proceed with cesarean section. Consent done. Risks vs benefits of surgery discussed.   Alvis Pulcini J 05/26/2018, 9:14 PM

## 2018-05-26 NOTE — Anesthesia Procedure Notes (Signed)
Epidural Patient location during procedure: OB Start time: 05/26/2018 12:31 PM End time: 05/26/2018 12:39 PM  Staffing Anesthesiologist: Mal Amabile, MD Performed: anesthesiologist   Preanesthetic Checklist Completed: patient identified, site marked, surgical consent, pre-op evaluation, timeout performed, IV checked, risks and benefits discussed and monitors and equipment checked  Epidural Patient position: sitting Prep: site prepped and draped and DuraPrep Patient monitoring: continuous pulse ox and blood pressure Approach: midline Location: L3-L4 Injection technique: LOR air  Needle:  Needle type: Tuohy  Needle gauge: 17 G Needle length: 9 cm and 9 Needle insertion depth: 5 cm cm Catheter type: closed end flexible Catheter size: 19 Gauge Catheter at skin depth: 10 cm Test dose: negative and Other  Assessment Events: blood not aspirated, injection not painful, no injection resistance, negative IV test and no paresthesia  Additional Notes Patient identified. Risks and benefits discussed including failed block, incomplete  Pain control, post dural puncture headache, nerve damage, paralysis, blood pressure Changes, nausea, vomiting, reactions to medications-both toxic and allergic and post Partum back pain. All questions were answered. Patient expressed understanding and wished to proceed. Sterile technique was used throughout procedure. Epidural site was Dressed with sterile barrier dressing. No paresthesias, signs of intravascular injection Or signs of intrathecal spread were encountered.  Patient was more comfortable after the epidural was dosed. Please see RN's note for documentation of vital signs and FHR which are stable.

## 2018-05-26 NOTE — Op Note (Signed)
Cesarean Section Procedure Note  Indications: failure to progress: arrest of descent and non-reassuring fetal status  Pre-operative Diagnosis: 41 week 3 day pregnancy.  Post-operative Diagnosis: same  Surgeon: Lenoard Aden   Assistants: Montez Hageman, CNM  Anesthesia: Epidural anesthesia and Local anesthesia 0.25.% bupivacaine  ASA Class: 2  Procedure Details  The patient was seen in the Holding Room. The risks, benefits, complications, treatment options, and expected outcomes were discussed with the patient.  The patient concurred with the proposed plan, giving informed consent. The risks of anesthesia, infection, bleeding and possible injury to other organs discussed. Injury to bowel, bladder, or ureter with possible need for repair discussed. Possible need for transfusion with secondary risks of hepatitis or HIV acquisition discussed. Post operative complications to include but not limited to DVT, PE and Pneumonia noted. The site of surgery properly noted/marked. The patient was taken to Operating Room # 9, identified as Sharon Burch and the procedure verified as C-Section Delivery. A Time Out was held and the above information confirmed.  After induction of anesthesia, the patient was draped and prepped in the usual sterile manner. A Pfannenstiel incision was made and carried down through the subcutaneous tissue to the fascia. Fascial incision was made and extended transversely using Mayo scissors. The fascia was separated from the underlying rectus tissue superiorly and inferiorly. The peritoneum was identified and entered. Peritoneal incision was extended longitudinally. The utero-vesical peritoneal reflection was incised transversely and the bladder flap was bluntly freed from the lower uterine segment. Bladder flap edeamtous and cephalad. A low transverse uterine incision(Kerr hysterotomy) was made. Delivered from OA presentation was a  female with Apgar scores of 8 at one minute  and 9 at five minutes. Bulb suctioning gently performed. Neonatal team in attendance.After the umbilical cord was clamped and cut cord blood was obtained for evaluation. The placenta was removed intact and appeared normal. The uterus was curetted with a dry lap pack. Good hemostasis was noted.The uterine outline, tubes and ovaries appeared normal. The uterine incision was closed with running locked sutures of 0 Monocryl x 2 layers. Interrupted closure of right cervical extension. Hemostasis was observed. The parietal peritoneum was closed with a running 2-0 Monocryl suture. The fascia was then reapproximated with running sutures of 0 Monocryl. The skin was reapproximated with 3-0 monocryl after Niland closure with 2-0 plain.  Instrument, sponge, and needle counts were correct prior the abdominal closure and at the conclusion of the case.   Findings: As noted  Estimated Blood Loss:  400         Drains: foley                 Specimens: placenta                 Complications:  None; patient tolerated the procedure well.         Disposition: PACU - hemodynamically stable.         Condition: stable  Attending Attestation: I performed the procedure.

## 2018-05-26 NOTE — Anesthesia Postprocedure Evaluation (Signed)
Anesthesia Post Note  Patient: Optician, dispensing  Procedure(s) Performed: CESAREAN SECTION (N/A )     Patient location during evaluation: PACU Anesthesia Type: Epidural Level of consciousness: awake and alert Pain management: pain level controlled Vital Signs Assessment: post-procedure vital signs reviewed and stable Respiratory status: spontaneous breathing, nonlabored ventilation and respiratory function stable Cardiovascular status: stable and blood pressure returned to baseline Postop Assessment: no headache, no backache and epidural receding Anesthetic complications: no    Last Vitals:  Vitals:   05/26/18 2233 05/26/18 2236  BP:  97/61  Pulse:  82  Resp:  15  Temp: 37.8 C   SpO2:  99%    Last Pain:  Vitals:   05/26/18 2236  TempSrc:   PainSc: 0-No pain   Pain Goal:    LLE Motor Response: Purposeful movement;Responds to commands (05/26/18 2236) LLE Sensation: Other (Comment)(pressure) (05/26/18 2236) RLE Motor Response: Purposeful movement;Responds to commands (05/26/18 2236) RLE Sensation: Other (Comment)(pressure ) (05/26/18 2236)      Kaylyn Layer

## 2018-05-26 NOTE — H&P (Signed)
Sharon Burch is a 37 y.o. female presenting for postdates IOL. OB History    Gravida  1   Para      Term      Preterm      AB      Living        SAB      TAB      Ectopic      Multiple      Live Births             Past Medical History:  Diagnosis Date  . Allergy   . Anemia    hx of  . Hypothyroidism 2007   treated x 4 years, then apparently TSH normalized   Past Surgical History:  Procedure Laterality Date  . VULVA SURGERY     as child  . WISDOM TOOTH EXTRACTION     Family History: family history includes Diabetes in her maternal grandmother and mother; Healthy in her father, mother, sister, sister, and sister; Skin cancer in her paternal grandfather. Social History:  reports that she has quit smoking. Her smoking use included cigarettes. She has never used smokeless tobacco. She reports that she drank alcohol. She reports that she does not use drugs.     Maternal Diabetes: No Genetic Screening: Normal Maternal Ultrasounds/Referrals: Normal Fetal Ultrasounds or other Referrals:  None Maternal Substance Abuse:  No Significant Maternal Medications:  None Significant Maternal Lab Results:  None Other Comments:  None  Review of Systems  Constitutional: Negative.   All other systems reviewed and are negative.  Maternal Medical History:  Reason for admission: Contractions.   Contractions: Onset was less than 1 hour ago.   Frequency: rare.    Fetal activity: Perceived fetal activity is normal.   Last perceived fetal movement was within the past hour.    Prenatal complications: no prenatal complications Prenatal Complications - Diabetes: none.    Dilation: 3 Effacement (%): 50 Station: -3 Exam by:: Chukwuemeka, S. RNC Blood pressure (!) 101/56, pulse 61, temperature 97.6 F (36.4 C), temperature source Oral, resp. rate 18, height 5\' 5"  (1.651 m), weight 79.3 kg. Maternal Exam:  Uterine Assessment: Contraction strength is mild.   Contraction frequency is irregular.   Abdomen: Patient reports no abdominal tenderness. Fetal presentation: vertex  Introitus: Normal vulva. Normal vagina.  Ferning test: not done.  Nitrazine test: not done. Amniotic fluid character: not assessed.  Pelvis: adequate for delivery.   Cervix: Cervix evaluated by digital exam.     Physical Exam  Nursing note and vitals reviewed. Constitutional: She is oriented to person, place, and time. She appears well-developed and well-nourished.  HENT:  Head: Normocephalic and atraumatic.  Neck: Normal range of motion. Neck supple.  Cardiovascular: Normal rate and regular rhythm.  Respiratory: Effort normal and breath sounds normal.  GI: Soft. Bowel sounds are normal.  Genitourinary: Vagina normal and uterus normal.  Musculoskeletal: Normal range of motion.  Neurological: She is alert and oriented to person, place, and time. She has normal reflexes.  Skin: Skin is warm and dry.  Psychiatric: She has a normal mood and affect.    Prenatal labs: ABO, Rh: --/--/A POS, A POS Performed at [facility], [address] [place], [place] [address]  [phone]/08 0100) Antibody: NEG (11/08 0100) Rubella: Immune (04/24 0000) RPR: Nonreactive (04/24 0000)  HBsAg: Negative (04/24 0000)  HIV: Non-reactive (04/24 0000)  GBS: Negative (09/26 0000)   Assessment/Plan: Postdates IOL cytotec   Teya Otterson J 05/26/2018, 6:46 AM

## 2018-05-26 NOTE — Anesthesia Pain Management Evaluation Note (Signed)
  CRNA Pain Management Visit Note  Patient: Sharon Burch, 37 y.o., female  "Hello I am a member of the anesthesia team at [facility]. We have an anesthesia team available at all times to provide care throughout the hospital, including epidural management and anesthesia for C-section. I don't know your plan for the delivery whether it a natural birth, water birth, IV sedation, nitrous supplementation, doula or epidural, but we want to meet your pain goals."   1.Was your pain managed to your expectations on prior hospitalizations?   No prior hospitalizations  2.What is your expectation for pain management during this hospitalization?     Epidural  3.How can we help you reach that goal? suport PRN  Record the patient's initial score and the patient's pain goal.   Pain: 2  Pain Goal: 9 [facility] wants you to be able to say your pain was always managed very well.  Jennelle Human 05/26/2018

## 2018-05-26 NOTE — Anesthesia Preprocedure Evaluation (Addendum)
Anesthesia Evaluation  Patient identified by MRN, date of birth, ID band Patient awake    Reviewed: Allergy & Precautions, NPO status , Patient's Chart, lab work & pertinent test results  History of Anesthesia Complications Negative for: history of anesthetic complications  Airway Mallampati: II  TM Distance: >3 FB Neck ROM: Full    Dental no notable dental hx. (+) Teeth Intact   Pulmonary former smoker,    Pulmonary exam normal breath sounds clear to auscultation       Cardiovascular negative cardio ROS Normal cardiovascular exam Rhythm:Regular Rate:Normal     Neuro/Psych negative neurological ROS  negative psych ROS   GI/Hepatic Neg liver ROS, GERD  ,  Endo/Other  Hypothyroidism Hx/o hypothyroidism treated for a few years, now euthyroid on no Rx  Renal/GU negative Renal ROS  negative genitourinary   Musculoskeletal negative musculoskeletal ROS (+)   Abdominal   Peds  Hematology  (+) anemia ,   Anesthesia Other Findings   Reproductive/Obstetrics (+) Pregnancy                            Anesthesia Physical Anesthesia Plan  ASA: II and emergent  Anesthesia Plan: Epidural   Post-op Pain Management:    Induction:   PONV Risk Score and Plan: 2 and Treatment may vary due to age or medical condition, Dexamethasone and Ondansetron  Airway Management Planned: Natural Airway  Additional Equipment:   Intra-op Plan:   Post-operative Plan:   Informed Consent: I have reviewed the patients History and Physical, chart, labs and discussed the procedure including the risks, benefits and alternatives for the proposed anesthesia with the patient or authorized representative who has indicated his/her understanding and acceptance.   Dental advisory given  Plan Discussed with: Anesthesiologist and CRNA  Anesthesia Plan Comments:        Anesthesia Quick Evaluation

## 2018-05-26 NOTE — Transfer of Care (Signed)
Immediate Anesthesia Transfer of Care Note  Patient: Sharon Burch  Procedure(s) Performed: CESAREAN SECTION (N/A )  Patient Location: PACU  Anesthesia Type:Epidural  Level of Consciousness: awake, alert  and oriented  Airway & Oxygen Therapy: Patient Spontanous Breathing  Post-op Assessment: Report given to RN and Post -op Vital signs reviewed and stable  Post vital signs: Reviewed and stable HR 86, RR 18, SaO2 97%, 97/61  Last Vitals:  Vitals Value Taken Time  BP    Temp 37.8 C 05/26/2018 10:33 PM  Pulse 83 05/26/2018 10:33 PM  Resp    SpO2 98 % 05/26/2018 10:33 PM  Vitals shown include unvalidated device data.  Last Pain:  Vitals:   05/26/18 2233  TempSrc: Oral  PainSc:          Complications: No apparent anesthesia complications

## 2018-05-26 NOTE — Progress Notes (Signed)
Pat Perez-Cross is a 37 y.o. G1P0 at [redacted]w[redacted]d by LMP admitted for induction of labor due to postdates.  Subjective: comfortable  Objective: BP 116/71   Pulse 87   Temp 98.9 F (37.2 C)   Resp 16   Ht 5\' 5"  (1.651 m)   Wt 79.3 kg   BMI 29.09 kg/m  No intake/output data recorded. No intake/output data recorded.  FHT:  FHR: 155 bpm, variability: moderate,  accelerations:  Present,  decelerations:  Absent UC:   regular, every 2-3 minutes SVE:   7-8/100 /0 IUPC placed  Labs: Lab Results  Component Value Date   WBC 8.6 05/26/2018   HGB 11.4 (L) 05/26/2018   HCT 34.2 (L) 05/26/2018   MCV 93.4 05/26/2018   PLT 252 05/26/2018    Assessment / Plan: Induction of labor due to postterm,  progressing well on pitocin  Labor: Progressing normally Preeclampsia:  no signs or symptoms of toxicity Fetal Wellbeing:  Category I Pain Control:  Epidural I/D:  n/a Anticipated MOD:  NSVD  Kellin Fifer J 05/26/2018, 6:31 PM

## 2018-05-27 ENCOUNTER — Encounter (HOSPITAL_COMMUNITY): Payer: Self-pay | Admitting: Obstetrics and Gynecology

## 2018-05-27 LAB — CBC
HEMATOCRIT: 33.1 % — AB (ref 36.0–46.0)
HEMOGLOBIN: 11.2 g/dL — AB (ref 12.0–15.0)
MCH: 31.4 pg (ref 26.0–34.0)
MCHC: 33.8 g/dL (ref 30.0–36.0)
MCV: 92.7 fL (ref 80.0–100.0)
NRBC: 0 % (ref 0.0–0.2)
Platelets: 196 10*3/uL (ref 150–400)
RBC: 3.57 MIL/uL — ABNORMAL LOW (ref 3.87–5.11)
RDW: 13.6 % (ref 11.5–15.5)
WBC: 23.8 10*3/uL — ABNORMAL HIGH (ref 4.0–10.5)

## 2018-05-27 MED ORDER — ZOLPIDEM TARTRATE 5 MG PO TABS: 5.0000 mg | ORAL_TABLET | Freq: Every evening | ORAL | Status: DC | PRN

## 2018-05-27 MED ORDER — WITCH HAZEL-GLYCERIN EX PADS: 1.0000 "application " | MEDICATED_PAD | CUTANEOUS | Status: DC | PRN

## 2018-05-27 MED ORDER — IBUPROFEN 600 MG PO TABS
600.0000 mg | ORAL_TABLET | Freq: Four times a day (QID) | ORAL | Status: DC
  Administered 2018-05-27 – 2018-05-30 (×13): 600 mg via ORAL
  Filled 2018-05-27 (×13): qty 1

## 2018-05-27 MED ORDER — DIPHENHYDRAMINE HCL 25 MG PO CAPS: 25.0000 mg | ORAL_CAPSULE | Freq: Four times a day (QID) | ORAL | Status: DC | PRN

## 2018-05-27 MED ORDER — DIBUCAINE 1 % RE OINT: 1.0000 "application " | TOPICAL_OINTMENT | RECTAL | Status: DC | PRN

## 2018-05-27 MED ORDER — ACETAMINOPHEN 325 MG PO TABS
650.0000 mg | ORAL_TABLET | ORAL | Status: DC | PRN
  Administered 2018-05-27 – 2018-05-28 (×2): 650 mg via ORAL
  Filled 2018-05-27 (×2): qty 2

## 2018-05-27 MED ORDER — SENNOSIDES-DOCUSATE SODIUM 8.6-50 MG PO TABS
2.0000 | ORAL_TABLET | ORAL | Status: DC
  Administered 2018-05-29 (×2): 2 via ORAL
  Filled 2018-05-27 (×3): qty 2

## 2018-05-27 MED ORDER — NALOXONE HCL 0.4 MG/ML IJ SOLN: 0.4000 mg | INTRAMUSCULAR | Status: DC | PRN

## 2018-05-27 MED ORDER — NALBUPHINE HCL 10 MG/ML IJ SOLN: 5.0000 mg | INTRAMUSCULAR | Status: DC | PRN

## 2018-05-27 MED ORDER — DIPHENHYDRAMINE HCL 25 MG PO CAPS: 25.0000 mg | ORAL_CAPSULE | ORAL | Status: DC | PRN

## 2018-05-27 MED ORDER — SIMETHICONE 80 MG PO CHEW
80.0000 mg | CHEWABLE_TABLET | ORAL | Status: DC | PRN
  Filled 2018-05-27: qty 1

## 2018-05-27 MED ORDER — NALOXONE HCL 4 MG/10ML IJ SOLN
1.0000 ug/kg/h | INTRAVENOUS | Status: DC | PRN
  Filled 2018-05-27: qty 5

## 2018-05-27 MED ORDER — COCONUT OIL OIL
1.0000 "application " | TOPICAL_OIL | Status: DC | PRN
  Administered 2018-05-29: 1 via TOPICAL
  Filled 2018-05-27: qty 120

## 2018-05-27 MED ORDER — OXYTOCIN 40 UNITS IN LACTATED RINGERS INFUSION - SIMPLE MED: 2.5000 [IU]/h | INTRAVENOUS | Status: AC

## 2018-05-27 MED ORDER — PRENATAL MULTIVITAMIN CH
1.0000 | ORAL_TABLET | Freq: Every day | ORAL | Status: DC
  Administered 2018-05-27 – 2018-05-30 (×4): 1 via ORAL
  Filled 2018-05-27 (×4): qty 1

## 2018-05-27 MED ORDER — SIMETHICONE 80 MG PO CHEW
80.0000 mg | CHEWABLE_TABLET | ORAL | Status: DC
  Administered 2018-05-27 – 2018-05-29 (×3): 80 mg via ORAL
  Filled 2018-05-27 (×2): qty 1

## 2018-05-27 MED ORDER — METHYLERGONOVINE MALEATE 0.2 MG PO TABS: 0.2000 mg | ORAL_TABLET | ORAL | Status: DC | PRN

## 2018-05-27 MED ORDER — SODIUM CHLORIDE 0.9% FLUSH: 3.0000 mL | INTRAVENOUS | Status: DC | PRN

## 2018-05-27 MED ORDER — NALBUPHINE HCL 10 MG/ML IJ SOLN: 5.0000 mg | Freq: Once | INTRAMUSCULAR | Status: DC | PRN

## 2018-05-27 MED ORDER — METHYLERGONOVINE MALEATE 0.2 MG/ML IJ SOLN: 0.2000 mg | INTRAMUSCULAR | Status: DC | PRN

## 2018-05-27 MED ORDER — LACTATED RINGERS IV SOLN
INTRAVENOUS | Status: DC
  Administered 2018-05-27: 10:00:00 via INTRAVENOUS

## 2018-05-27 MED ORDER — KETOROLAC TROMETHAMINE 30 MG/ML IJ SOLN: 30.0000 mg | Freq: Four times a day (QID) | INTRAMUSCULAR | Status: DC | PRN

## 2018-05-27 MED ORDER — SIMETHICONE 80 MG PO CHEW
80.0000 mg | CHEWABLE_TABLET | Freq: Three times a day (TID) | ORAL | Status: DC
  Administered 2018-05-27 – 2018-05-30 (×9): 80 mg via ORAL
  Filled 2018-05-27 (×9): qty 1

## 2018-05-27 MED ORDER — MENTHOL 3 MG MT LOZG: 1.0000 | LOZENGE | OROMUCOSAL | Status: DC | PRN

## 2018-05-27 MED ORDER — ONDANSETRON HCL 4 MG/2ML IJ SOLN: 4.0000 mg | Freq: Three times a day (TID) | INTRAMUSCULAR | Status: DC | PRN

## 2018-05-27 MED ORDER — TETANUS-DIPHTH-ACELL PERTUSSIS 5-2.5-18.5 LF-MCG/0.5 IM SUSP: 0.5000 mL | Freq: Once | INTRAMUSCULAR | Status: DC

## 2018-05-27 MED ORDER — ACETAMINOPHEN 500 MG PO TABS
1000.0000 mg | ORAL_TABLET | Freq: Four times a day (QID) | ORAL | Status: AC
  Administered 2018-05-27 (×2): 1000 mg via ORAL
  Filled 2018-05-27 (×2): qty 2

## 2018-05-27 MED ORDER — OXYCODONE-ACETAMINOPHEN 5-325 MG PO TABS
1.0000 | ORAL_TABLET | ORAL | Status: DC | PRN
  Administered 2018-05-29 – 2018-05-30 (×3): 1 via ORAL
  Filled 2018-05-27 (×5): qty 1

## 2018-05-27 MED ORDER — OXYCODONE-ACETAMINOPHEN 5-325 MG PO TABS
2.0000 | ORAL_TABLET | ORAL | Status: DC | PRN
  Administered 2018-05-28 – 2018-05-29 (×3): 2 via ORAL
  Filled 2018-05-27 (×3): qty 2

## 2018-05-27 MED ORDER — DIPHENHYDRAMINE HCL 50 MG/ML IJ SOLN: 12.5000 mg | INTRAMUSCULAR | Status: DC | PRN

## 2018-05-27 NOTE — Progress Notes (Signed)
Post Partum Day 1, POD 1 Subjective: no complaints, up ad lib, voiding and tolerating PO  Pain relieved.  Objective: Blood pressure 110/78, pulse 82, temperature 97.6 F (36.4 C), temperature source Axillary, resp. rate 17, height 5\' 5"  (1.651 m), weight 79.3 kg, SpO2 95 %, unknown if currently breastfeeding.  Physical Exam:  General: alert, cooperative and appears stated age Lochia: appropriate Uterine Fundus: firm Incision: healing well, no significant drainage DVT Evaluation: No evidence of DVT seen on physical exam. Negative Homan's sign. No cords or calf tenderness.  Recent Labs    05/26/18 0105 05/27/18 0620  HGB 11.4* 11.2*  HCT 34.2* 33.1*    Assessment/Plan: Stable POD 1 Routine PP care OOB, ambulate Breastfeeding   LOS: 1 day   Carlon Davidson J 05/27/2018, 11:06 AM

## 2018-05-27 NOTE — Progress Notes (Addendum)
Pharmacy Antibiotic Note  Sharon Burch is a 37 y.o. female admitted on 05/26/2018 for IOL for postdates.  Was taken to the OR for C/S and had fever afterwards.  Starting gentamicin and clindamycin for chorioamnionitis.   Plan: Gentamicin 5mg /kg adjusted weight x 1 dose.  Will re-evaluate further dosing if antibiotics continued beyond 24 hours post-op.    Height: 5\' 5"  (165.1 cm) Weight: 174 lb 12.8 oz (79.3 kg) IBW/kg (Calculated) : 57  Temp (24hrs), Avg:99.1 F (37.3 C), Min:98.2 F (36.8 C), Max:100.1 F (37.8 C)  Recent Labs  Lab 05/26/18 0105 05/27/18 0620  WBC 8.6 23.8*    CrCl cannot be calculated (Patient's most recent lab result is older than the maximum 21 days allowed.).    Allergies  Allergen Reactions  . Penicillins Rash    Has patient had a PCN reaction causing immediate rash, facial/tongue/throat swelling, SOB or lightheadedness with hypotension: No Has patient had a PCN reaction causing severe rash involving mucus membranes or skin necrosis: No Has patient had a PCN reaction that required hospitalization: No Has patient had a PCN reaction occurring within the last 10 years: No If all of the above answers are "NO", then may proceed with Cephalosporin use.     Antimicrobials this admission: Clindamycin 900mg  IV Q8  11/9 >>  Gentamicin 5mg /kg x 1 11/9    Thank you for allowing pharmacy to be a part of this patient's care.  Loyola Mast 05/27/2018 9:08 AM

## 2018-05-27 NOTE — Addendum Note (Signed)
Addendum  created 05/27/18 1610 by Elgie Congo, CRNA   Sign clinical note

## 2018-05-27 NOTE — Lactation Note (Signed)
This note was copied from a baby's chart. Lactation Consultation Note  Patient Name: Sharon Burch Today's Date: 05/27/2018 Reason for consult: Initial assessment;Primapara;1st time breastfeeding;Term;Maternal endocrine disorder(AMA) Type of Endocrine Disorder?: Thyroid  15 hours old FT female who is being exclusively BF by her mother, she's a P1. She has 2 DEBP at home, a Medela and a Spectra one and requested assistance on how to use them. Mom didn't bring them to the hospital, but dad will and she'll request LC assistance to review them prior her discharge. When doing hand expression with mom, she was able to get a small droplet of colostrum our of her left breast; her tissue is semi-compressible.  Offered assistance with latch, baby was doing STS with mom when entering the room and was wide awake, already showing cues. LC took baby STS to mother's left breast in football position and she was able to latch after a few tries. No audible swallows noted, LC needed to reposition baby several times because she kept breaking the latch. When doing suck training baby would clamp on LC's finger, very little to no sucking. Showed mom how to do the teacup hold, and she was able to relatch her every single time. Baby still nursing when exiting the room. Cluster feeding was discussed as well as baby's sleeping cycle and the onset of lactogenesis II.  Feeding plan:  1. Encouraged mom to feed baby STS 8-12 times/24 hours or sooner if feeding cues are present 2. Hand expression and spoon feeding was also encouraged. 3. Mom will let staff know when she bring her DEBP, she'd like to start pumping at the hospital for maximum breast stimulation. Family very supportive with BF  BF brochure (SP), BF resources, and feeding diary (SP) were reviewed. Parents reported all questions and concerns were answered, they're both aware of LC services and will call PRN.  Maternal Data Formula Feeding for Exclusion:  No Has patient been taught Hand Expression?: Yes Does the patient have breastfeeding experience prior to this delivery?: No  Feeding Feeding Type: Breast Fed  LATCH Score Latch: Repeated attempts needed to sustain latch, nipple held in mouth throughout feeding, stimulation needed to elicit sucking reflex.  Audible Swallowing: None  Type of Nipple: Everted at rest and after stimulation  Comfort (Breast/Nipple): Soft / non-tender  Hold (Positioning): Assistance needed to correctly position infant at breast and maintain latch.  LATCH Score: 6  Interventions Interventions: Breast feeding basics reviewed;Assisted with latch;Skin to skin;Breast massage;Hand express;Breast compression;Adjust position;Support pillows  Lactation Tools Discussed/Used WIC Program: No   Consult Status Consult Status: Follow-up Date: 05/28/18 Follow-up type: In-patient    Karnisha Lefebre Venetia Constable 05/27/2018, 1:15 PM

## 2018-05-27 NOTE — Anesthesia Postprocedure Evaluation (Signed)
Anesthesia Post Note  Patient: Optician, dispensing  Procedure(s) Performed: CESAREAN SECTION (N/A )     Patient location during evaluation: Mother Baby Anesthesia Type: Epidural Level of consciousness: awake and alert Pain management: pain level controlled Vital Signs Assessment: post-procedure vital signs reviewed and stable Respiratory status: spontaneous breathing, nonlabored ventilation and respiratory function stable Cardiovascular status: stable Postop Assessment: no headache, no backache, epidural receding, able to ambulate, adequate PO intake, no apparent nausea or vomiting and patient able to bend at knees Anesthetic complications: no    Last Vitals:  Vitals:   05/27/18 0130 05/27/18 0230  BP: (!) 119/59 (!) 106/59  Pulse: 71 68  Resp: 20 18  Temp: 36.9 C 37 C  SpO2: 95% 95%    Last Pain:  Vitals:   05/27/18 0520  TempSrc:   PainSc: 0-No pain   Pain Goal:                 [employer]

## 2018-05-28 NOTE — Lactation Note (Addendum)
This note was copied from a baby's chart. Lactation Consultation Note  Patient Name: Sharon Burch [id number] Date: 05/28/2018 Reason for consult: Follow-up assessment;Mother's request;1st time breastfeeding;Primapara;Term Type of Endocrine Disorder?: Thyroid  P1 mother whose infant is now 6 hours old.  Baby was in bassinet sleeping.  I suggested mother begin pumping now since baby continues to be sleepy.  Mother in agreement, however, she wishes to shower first and then she will call me back for pump instructions.  I set up the DEBP at bedside and will wait for her return call.  RN notified.  Mother has a DEBP for home use.  She will have 12 weeks of leave and then will try to work from home for the first 6 months.   Maternal Data Formula Feeding for Exclusion: No Has patient been taught Hand Expression?: Yes Does the patient have breastfeeding experience prior to this delivery?: No  Feeding Feeding Type: Breast Fed  LATCH Score Latch: Too sleepy or reluctant, no latch achieved, no sucking elicited.  Audible Swallowing: None  Type of Nipple: Everted at rest and after stimulation  Comfort (Breast/Nipple): Soft / non-tender  Hold (Positioning): Assistance needed to correctly position infant at breast and maintain latch.  LATCH Score: 5  Interventions Interventions: Breast feeding basics reviewed;Assisted with latch;Skin to skin;Breast massage;Hand express  Lactation Tools Discussed/Used WIC Program: No Pump Review: Setup, frequency, and cleaning;Milk Storage Initiated by:: Binnie Vonderhaar Date initiated:: 05/29/18   Consult Status Consult Status: Follow-up Date: 05/29/18 Follow-up type: In-patient    Sharon Burch 05/28/2018, 4:20 PM

## 2018-05-28 NOTE — Progress Notes (Signed)
Subjective: POD# 2 Live born female  Birth Weight: 7 lb 9.5 oz (3445 g) APGAR: 8, 9  Newborn Delivery   Birth date/time:  05/26/2018 21:40:00 Delivery type:  C-Section, Low Transverse C-section categorization:  Primary     Delivering provider: Olivia Mackie   Feeding: breast  Pain control at delivery: Epidural   Patient and spouse sleeping along w/ infant in [facility] c[facility] at [facility] Per RN report: Pain controlled with PO meds Denies HA/SOB/C/P/N/V/dizziness. Flatus present, vaginal bleeding as normal, without clots.  She is ambulating, urinating without difficulty.     Objective:   VS:    Vitals:   05/27/18 1330 05/27/18 1750 05/28/18 0845 05/28/18 1027  BP: (!) 101/56 98/63 99/61    Pulse: 69 72 66   Resp: 16 16 20 18   Temp: 97.7 F (36.5 C) 98.3 F (36.8 C) 98.2 F (36.8 C)   TempSrc: Oral Oral Tympanic   SpO2: 97% 99%    Weight:      Height:          Intake/Output Summary (Last 24 hours) at 05/28/2018 1039 Last data filed at 05/28/2018 0100 Gross per 24 hour  Intake -  Output 2550 ml  Net -2550 ml        Recent Labs    05/26/18 0105 05/27/18 0620  WBC 8.6 23.8*  HGB 11.4* 11.2*  HCT 34.2* 33.1*  PLT 252 196     Blood type: --/--/A POS, A POS Performed at [facility], [address], [place], [place] [address]  (11/08 0100)  Rubella: Immune (04/24 0000)     Physical Exam:   Exam deferred for patient rest   Assessment/Plan: 37 y.o.   POD# 2. G1P1001                  Principal Problem:   Postpartum care following cesarean delivery (11/8) Active Problems:   Encounter for induction of labor   1C/S - Indication: AOD and NRFHT Presumed chorio, afebrile since delivery D/C Cleocin IV  Doing well, stable.    Routine post-op care  Neta Mends, CNM, MSN 05/28/2018, 10:39 AM

## 2018-05-28 NOTE — Lactation Note (Signed)
This note was copied from a baby's chart. Lactation Consultation Note  Patient Name: Sharon Burch ZOXWR'U Date: 05/28/2018 Reason for consult: Follow-up assessment;1st time breastfeeding;Primapara;Term;Difficult latch;Mother's request Type of Endocrine Disorder?: Thyroid  P1 mother whose infant is now 43 hours old.  Mother requested latch assistance.  Baby was being held at the breast when I arrived but was sleeping and not showing feeding cues.  Mother stated that she had been showing cues.  Offered to assist with latching and mother accepted.  Mother had her dressed and swaddled.  I suggested STS with all feedings and mother undressed baby.  She still did not show any cues.  Mother was also interested in doing the cradle hold but when I explained why this is not the preferred hold for a newborn she was willing to try another hold.  Mother does not care for the football hold.  I attempted to latch baby onto the left breast in the cross cradle hold.  After a couple of attempts she latched and took two sucks before stopping.  Gentle stimulation did not arouse her.  I burped her, rubbed her back, stimulated her and attempted to latch again.  Even though she was more awake she still refused to suck.  Breast compressions performed and baby did not begin sucking.  Suggested mother do STS and observe for more feeding cues.  When she cues again, mother will call for assistance.  Mother could not express any colostrum with hand expression.  I offered to begin the DEBP but she is not interested at this time.  I will suggest this again after the next feeding attempt if baby does not awaken and latch.     Maternal Data Formula Feeding for Exclusion: No Has patient been taught Hand Expression?: Yes Does the patient have breastfeeding experience prior to this delivery?: No  Feeding Feeding Type: Breast Fed  LATCH Score Latch: Too sleepy or reluctant, no latch achieved, no sucking  elicited.  Audible Swallowing: None  Type of Nipple: Everted at rest and after stimulation  Comfort (Breast/Nipple): Soft / non-tender  Hold (Positioning): Assistance needed to correctly position infant at breast and maintain latch.  LATCH Score: 5  Interventions Interventions: Breast feeding basics reviewed;Assisted with latch;Skin to skin;Breast massage;Hand express  Lactation Tools Discussed/Used WIC Program: No   Consult Status Consult Status: Follow-up Date: 05/29/18 Follow-up type: In-patient    Artie Takayama R Nil Bolser 05/28/2018, 1:16 PM

## 2018-05-29 ENCOUNTER — Other Ambulatory Visit: Payer: Self-pay

## 2018-05-29 NOTE — Lactation Note (Signed)
This note was copied from a baby's chart. Lactation Consultation Note Baby 19 hrs old. Sleepy. Cues, not aggressive at the breast.  Mom has short shaft nipples. Becoming less compressible d/t breast starting to fill. LC hand expressed a few drops to soften breast tissue.  Encouraged nipple stimulation to evert for deeper latch. At this time baby is unable to maintain a deep latch. Fitted mom w/20 NS.  Baby latched, not very active at the breast. Needed constant stimulation to get baby to suckle at the breast. Baby finally started coordinated suckle rhythm.  Shells given to mom. Mom placed in bra to opposite breast feeding. Hand pump given to pre-pump before latching or application of NS. Baby fussing cueing. Latched using NS. Held in mouth the same as w/o NS.  Much stimulation needed to keep baby suckling. Baby had BM, LC changed diaper. Placed on breast, a few suckles.  Baby is having adequate output. LC doesn't feel that baby is able to obtain deep latch or transfer adequate colostrum d/t breast filling.  Mom has DEBP at bedside states she hasn't used it.  Discussed positioning, support, comfort, props, breast massage, hand expression, STS, I&O, spoon feeding. Encouraged mom to call for assistance as needed. LC does not recommend mom and baby to be d/c home today d/t BF issues.  Patient Name: Sharon Burch ZOXWR'U Date: 05/29/2018 Reason for consult: Follow-up assessment;Difficult latch Type of Endocrine Disorder?: Thyroid   Maternal Data    Feeding Feeding Type: Breast Fed  LATCH Score Latch: Repeated attempts needed to sustain latch, nipple held in mouth throughout feeding, stimulation needed to elicit sucking reflex.  Audible Swallowing: None  Type of Nipple: Everted at rest and after stimulation(semi flat)  Comfort (Breast/Nipple): Soft / non-tender  Hold (Positioning): Assistance needed to correctly position infant at breast and maintain latch.  LATCH Score:  6  Interventions Interventions: Breast feeding basics reviewed;Support pillows;Assisted with latch;Position options;Skin to skin;Expressed milk;Breast massage;Hand express;Shells;Pre-pump if needed;Hand pump;Reverse pressure;Breast compression;Adjust position;DEBP  Lactation Tools Discussed/Used Tools: Shells;Pump;Nipple Shields Nipple shield size: 20 Shell Type: Inverted Breast pump type: Double-Electric Breast Pump;Manual Pump Review: Setup, frequency, and cleaning;Milk Storage Initiated by:: Peri Jefferson RN IBCLC Date initiated:: 05/29/18   Consult Status Consult Status: Follow-up Date: 05/29/18 Follow-up type: In-patient    Charyl Dancer 05/29/2018, 5:17 AM

## 2018-05-29 NOTE — Lactation Note (Addendum)
This note was copied from a baby's chart. Lactation Consultation Note  Patient Name: Sharon Burch WUJWJ'X Date: 05/29/2018 Reason for consult: Follow-up assessment;1st time breastfeeding;Primapara;Infant weight loss;Difficult latch;Term;Maternal endocrine disorder Type of Endocrine Disorder?: Thyroid  29 hours old FT female who is now being partially BF and formula fed by her mother, baby is on Similac 20 calorie formula. LC stopped by mom's room for the next scheduled feeding, baby was still asleep. Mom agreed to wake baby up, LC placed her STS to mom's breast but she wouldn't latch. She'd suck on LC's finger but not at the breast. Tried the NS#20 she was given earlier today, baby would suck briefly on it, but then would fall asleep at the breast. Mom tried burping baby on and moving her around to get her to wake up but she wouldn't.  Mom voiced she was very frustrated and confused with BF, since she's been given so many tools to assist when taking baby to the breast, and also for supplementing. She also finds there is a language barrier, mom is Spanish speaker as noted on her language preference; and even though she speaks some English is hard for her to understand when [place] speakers talk "too fast" especially medical terminology. LC asked mom what she wants to do/try so we can tailor a feeding plan that works for her and her baby.  Mom wants to keep things simple, and still be able to take baby to the breast. LC stressed the importance of supplementation until her milk is fully in, and also discussed cluster feeding along with baby's sleeping cycle. Mom is agreeable about feeding baby every 3 hours during the daytime if no cues are present, but not during the nighttime. LC recommended mom not to go more than 5-6 hours at night without feeding baby and that she should wake baby up at least once during the night if baby doesn't wake up on her own.  LC assisted with syringe feedings,  first with 2 ml of her breast milk and then with 12 ml of Similac formula. Mom still frustrated because she keeps saying that her baby "eats just fine" when the hospital staff is not around, per mom baby does "beautifully feeding" when it's just the two of them in the room. LC showed empathy towards mom and let her know she can call for assistance anytime she needs it wether is for BF or to help her supplement baby with formula.  Feeding plan:  1. Encouraged mom to keep putting baby to the breast STS 8-12 times/24 hours or sooner if feeding cues are present 2- Awaken baby well for feedings at the 3 hrs mark if she has not cued 3- She may use the NS# 20 PRN to help stimulate baby's suck (only if it works, she may also decide to d/c) 4- Supplement with 15-30 ml of EBM+/formula by curved tip syringe; LC showed her how to 5- Pump both breasts on initiation setting for 15 minutes, after feedings or feeding attempts  Mom reported all questions and concerns were answered, she's aware of LC services and will call PRN.   Maternal Data    Feeding Feeding Type: Formula  LATCH Score Latch: Repeated attempts needed to sustain latch, nipple held in mouth throughout feeding, stimulation needed to elicit sucking reflex.  Audible Swallowing: None  Type of Nipple: Everted at rest and after stimulation  Comfort (Breast/Nipple): Soft / non-tender  Hold (Positioning): Full assist, staff holds infant at breast  LATCH Score:  5  Interventions Interventions: Breast feeding basics reviewed;Assisted with latch;Skin to skin;Breast massage;Hand express;Breast compression;Adjust position;Expressed milk;Support pillows  Lactation Tools Discussed/Used Tools: Other (comment)(curved tip syringe) Nipple shield size: 20   Consult Status Consult Status: Follow-up Date: 05/30/18 Follow-up type: In-patient    Ahlam Piscitelli Venetia Constable 05/29/2018, 6:26 PM

## 2018-05-29 NOTE — Progress Notes (Signed)
POD #3 S/p C/S  S; c/o incisional pain tol reg diet. Baby staying due to weight loss  O; BP (!) 99/58 (BP Location: Right Arm)   Pulse 76   Temp 97.9 F (36.6 C) (Oral)   Resp 18   Ht 5\' 5"  (1.651 m)   Wt 79.3 kg   SpO2 97%   Breastfeeding? Unknown   BMI 29.09 kg/m  Lung clear to A  Cor RRR  breast full non tender Abd soft uterus at umb firm Incision: c/d/i no evidence of ecchymosis or exudate Pad mod lochia  extr no edema  IMP: POD # 3 P) cont inpt postpartum care. Pain mgmt disc

## 2018-05-29 NOTE — Lactation Note (Signed)
This note was copied from a baby's chart. Lactation Consultation Note  Patient Name: Sharon Burch WUJWJ'X Date: 05/29/2018 Reason for consult: Follow-up assessment;Primapara;1st time breastfeeding;Term;Infant weight loss Type of Endocrine Disorder?: Thyroid  Visited with P1 Mom of term baby at 59 hrs old.  Baby at 9.3% weight loss with adequate output noted.  Mom assisted with first double pumping by her RN this am.  Reviewed pumping and importance of cleaning of pump parts after each pumping.   Talked with Mom about how baby is doing breastfeeding.  Mom feels that baby was sleepy when LC assisted this am, but usually, baby would latch and breastfeed well.  Talked to Mom about what a "good" latch would feel and look like.  No nipple trauma noted.  Mom finished her pumping, left breast expressed 2 ml of colostrum.  Left breast very wet with expressed milk.  Unwrapped and undressed baby and she easily awakened.  Reminded Mom of importance of waking baby at least every 3 hrs today, due to weight loss.  It had been 4 hrs since last feeding.  Baby positioned STS in football hold by left breast.  Baby opened semi-wide and attempted to latch, but no sucking noted.  Baby didn't root or make any attempt to altch.   Initiated a 20 mm nipple shield, to see if this added length to nipple would stimulate baby to suckle.  Mom's breasts are round, and nipples are erect with compressible areola.  Reassured that a nipple shield shouldn't be needed long term. Baby still not interested in latching and feeding.  Instilled the 2 ml of colostrum expressed into nipple shield, and baby gave a mild effort to feed.  Using curved tip syringe and constant stimulation, baby was fed another 8 ml of formula.  Baby not vigorous with feeding.  Placed baby STS on Mom's chest for burping, baby asleep.    Plan- 1- Keep baby STS as much as possible today 2- Awaken baby well for feedings at 3 hrs, or feed sooner if baby  acts hungry sooner 3- use nipple shield prn to help stimulate baby's suck 4- Supplement with 15-30 ml of EBM+/formula by curved tip syringe, cup, paced bottle, or 5 fr feeding tube and syringe 5- pump both breasts on initiation setting for 15 mins, adding breast massage and hand expression to express most milk for baby. 6- ask for assistance prn.  Interventions Interventions: Breast feeding basics reviewed;Assisted with latch;Skin to skin;Breast massage;Hand express;Support pillows;Adjust position;Breast compression;Pre-pump if needed;Position options;Expressed milk;Shells;DEBP  Lactation Tools Discussed/Used Tools: Shells;Pump;Nipple Shields Nipple shield size: 20 Shell Type: Inverted Breast pump type: Double-Electric Breast Pump Pump Review: Setup, frequency, and cleaning Initiated by:: RN Date initiated:: 05/29/18   Consult Status Consult Status: Follow-up Date: 05/30/18 Follow-up type: In-patient    Sharon Burch 05/29/2018, 9:33 AM

## 2018-05-30 MED ORDER — IBUPROFEN 600 MG PO TABS: 600.0000 mg | ORAL_TABLET | Freq: Four times a day (QID) | ORAL | 0 refills | Status: DC

## 2018-05-30 MED ORDER — OXYCODONE-ACETAMINOPHEN 5-325 MG PO TABS: 1.0000 | ORAL_TABLET | ORAL | 0 refills | Status: DC | PRN

## 2018-05-30 MED ORDER — SIMETHICONE 80 MG PO CHEW: 80.0000 mg | CHEWABLE_TABLET | Freq: Three times a day (TID) | ORAL | 0 refills | Status: DC

## 2018-05-30 NOTE — Progress Notes (Signed)
Offered spanish interpreter for discharge teaching and patient declined and stated that she speaks english.

## 2018-05-30 NOTE — Progress Notes (Signed)
POSTOPERATIVE DAY # 4 S/P Primary LTCS, baby girl "Renae Fickle"   S:         Reports feeling much better today; ready to be discharged home              Tolerating po intake / no nausea / no vomiting / + flatus / + BM  Denies dizziness, SOB, or CP             Bleeding is moderate             Pain controlled with Motrin and Percocet             Up ad lib / ambulatory/ voiding QS  Newborn breast feeding with formula supplementation - reports frustration with lactation services she has received.  Plans to continue to breastfeed and pump at home    O:  VS: BP 107/73 (BP Location: Right Arm)   Pulse 67   Temp 98 F (36.7 C) (Oral)   Resp 18   Ht 5\' 5"  (1.651 m)   Wt 79.3 kg   SpO2 98%   Breastfeeding? Unknown   BMI 29.09 kg/m    LABS:              No results for input(s): WBC, HGB, PLT in the last 72 hours.             Bloodtype: --/--/A POS, A POS Performed at [facility], [address] [place], [place] [address]  (11/08 0100)  Rubella: Immune (04/24 0000)                                             I&O: Intake/Output    None                Physical Exam:             Alert and Oriented X3  Lungs: Clear and unlabored  Heart: regular rate and rhythm / no murmurs  Abdomen: soft, non-tender, non-distended, active bowel sounds in all quadrants             Fundus: firm, non-tender, U-3             Dressing: honeycomb dressing c/d/i              Incision:  approximated with sutures and Dermabond / no erythema / no ecchymosis / no drainage  Perineum: intact  Lochia: small, no clots   Extremities: trace pedal edema, no calf pain or tenderness,   A:        POD # 4 S/P Primary LTCS             Mild ABL Anemia   P:        Routine postoperative care              Discharge home today  WOB discharge book, warning s/s and instructions reviewed   F/u with D. Taavon in 6 weeks   Carlean Jews, MSN, CNM [facility]

## 2018-05-30 NOTE — Lactation Note (Signed)
This note was copied from a baby's chart. Lactation Consultation Note  Patient Name: Sharon Burch Today's Date: 05/30/2018   Mom is choosing to continue with regular Similac instead of the Alimentum mentioned by the pediatrician (and brought in by the RN).  Mom is not interested in speaking with lactation.   [name], [name] [facility] 05/30/2018, 10:34 AM

## 2018-05-30 NOTE — Discharge Summary (Signed)
Obstetric Discharge Summary   Patient Name: Sharon Burch DOB: 1981-02-20 MRN: [id number]  Date of Admission: 05/26/2018 Date of Discharge: 05/30/2018 Date of Delivery: 05/26/18 Gestational Age at Delivery: [redacted]w[redacted]d  Primary OB: Sharon Burch  Antepartum complications:  - Hx. Of hypothyroidism, treated, now normalized - AMA Prenatal Labs:  ABO, Rh: --/--/A POS, A POS Performed at [facility], [address], [place], [place] [address]  [phone]/08 0100) Antibody: NEG (11/08 0100) Rubella: Immune (04/24 0000) RPR: Nonreactive (04/24 0000)  HBsAg: Negative (04/24 0000)  HIV: Non-reactive (04/24 0000)  GBS: Negative (09/26 0000)   Admitting Diagnosis: IOL at 41+3 weeks for postdates  Secondary Diagnoses: Patient Active Problem List   Diagnosis Date Noted  . 1C/S - Indication: AOD and NRFHT 05/28/2018  . Postpartum care following cesarean delivery (11/8) 05/27/2018  . Encounter for induction of labor 05/26/2018  . 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 Raynaud disease 11/24/2016  . Hypothyroidism 07/19/2005    Augmentation: Cytotec, AROM, Pitocin Complications: Fetal intolerance to labor; failure to descend  Date of Delivery: 05/26/18 Delivered By: Dr. Billy Burch T. Fredric Mare, CNM assist Delivery Type: primary cesarean section, low transverse incision Anesthesia: epidural  Newborn Data: Live born female  Birth Weight: 7 lb 9.5 oz (3445 g) APGAR: 8, 9  Newborn Delivery   Birth date/time:  05/26/2018 21:40:00 Delivery type:  C-Section, Low Transverse C-section categorization:  Primary        Hospital/Postpartum Course  (Cesarean Section):  Pt. Admitted for postdates induction at 41+3 weeks. See notes and operative report for details. Patient had an uncomplicated postpartum course, but stayed an extra day due to infant poor feeding.  By time of discharge on POD#4, her pain was controlled on oral pain medications; she  had appropriate lochia and was ambulating, voiding without difficulty, tolerating regular diet and passing flatus.   She was deemed stable for discharge to home.     Labs: CBC Latest Ref Rng & Units 05/27/2018 05/26/2018 02/09/2017  WBC 4.0 - 10.5 K/uL 23.8(H) 8.6 6.9  Hemoglobin 12.0 - 15.0 g/dL 11.2(L) 11.4(L) 14.3  Hematocrit 36.0 - 46.0 % 33.1(L) 34.2(L) 42.8  Platelets 150 - 400 K/uL 196 252 311   Conflict (See Lab Report): A POS/A POS Performed at [facility], [address] [place], [place] [address]   Physical exam:  BP 107/73 (BP Location: Right Arm)   Pulse 67   Temp 98 F (36.7 C) (Oral)   Resp 18   Ht 5\' 5"  (1.651 m)   Wt 79.3 kg   SpO2 98%   Breastfeeding? Unknown   BMI 29.09 kg/m  General: alert and no distress Pulm: normal respiratory effort Lochia: appropriate Abdomen: soft, NT Uterine Fundus: firm, below umbilicus Perineum: healing well, no significant erythema, no significant edema Incision: c/d/i, healing well, no significant drainage, no dehiscence, no significant erythema Extremities: No evidence of DVT seen on physical exam. No lower extremity edema.   Disposition: stable, discharge to home Baby Feeding: breast milk and formula Baby Disposition: home with mom  Contraception: discussed, but undecided   Rh Immune globulin given: N/A Rubella vaccine given: N/A Tdap vaccine given in AP or PP setting: UTD Flu vaccine given in AP or PP setting: declined    Plan:  [name] was discharged to home in good condition. Follow-up appointment at [facility] in 6 weeks.  Discharge Instructions: Per After Visit Summary. Refer to After Visit Summary and [facility] discharge booklet  Activity: Advance as tolerated. Pelvic rest for 6 weeks.   Diet: Regular, Heart Healthy Discharge Medications: Allergies as of 05/30/2018      Reactions   Penicillins Rash   Has patient had a PCN reaction causing immediate rash, facial/tongue/throat  swelling, SOB or lightheadedness with hypotension: No Has patient had a PCN reaction causing severe rash involving mucus membranes or skin necrosis: No Has patient had a PCN reaction that required hospitalization: No Has patient had a PCN reaction occurring within the last 10 years: No If all of the above answers are "NO", then may proceed with Cephalosporin use.      Medication List    TAKE these medications   ibuprofen 600 MG tablet Commonly known as:  ADVIL,MOTRIN Take 1 tablet (600 mg total) by mouth every 6 (six) hours.   oxyCODONE-acetaminophen 5-325 MG tablet Commonly known as:  PERCOCET/ROXICET Take 1 tablet by mouth every 4 (four) hours as needed (pain scale 4-7).   PRENATAL PO Take by mouth daily.   simethicone 80 MG chewable tablet Commonly known as:  MYLICON Chew 1 tablet (80 mg total) by mouth 3 (three) times daily after meals.      Outpatient follow up:  Follow-up Information    Olivia Mackie, MD. Schedule an appointment as soon as possible for a visit in 6 week(s).   Specialty:  Obstetrics and Gynecology Why:  Postpartum visit Contact information: [address] [place] [address] [phone]           Signed:  Carlean Jews, MSN, CNM [facility]

## 2018-05-31 ENCOUNTER — Telehealth: Payer: Self-pay

## 2018-05-31 NOTE — Telephone Encounter (Signed)
Called patient to see if she may need follow up appointment post hospitalization.

## 2018-07-18 DIAGNOSIS — Z124 Encounter for screening for malignant neoplasm of cervix: Secondary | ICD-10-CM | POA: Diagnosis not present

## 2018-07-18 DIAGNOSIS — Z1151 Encounter for screening for human papillomavirus (HPV): Secondary | ICD-10-CM | POA: Diagnosis not present

## 2018-08-30 DIAGNOSIS — Z5189 Encounter for other specified aftercare: Secondary | ICD-10-CM | POA: Diagnosis not present

## 2018-09-13 DIAGNOSIS — Z23 Encounter for immunization: Secondary | ICD-10-CM | POA: Diagnosis not present

## 2018-09-13 DIAGNOSIS — L858 Other specified epidermal thickening: Secondary | ICD-10-CM | POA: Diagnosis not present

## 2018-09-13 DIAGNOSIS — D225 Melanocytic nevi of trunk: Secondary | ICD-10-CM | POA: Diagnosis not present

## 2018-09-13 DIAGNOSIS — L815 Leukoderma, not elsewhere classified: Secondary | ICD-10-CM | POA: Diagnosis not present

## 2018-09-13 DIAGNOSIS — L723 Sebaceous cyst: Secondary | ICD-10-CM | POA: Diagnosis not present

## 2018-12-05 ENCOUNTER — Telehealth: Payer: Self-pay | Admitting: Plastic Surgery

## 2018-12-05 NOTE — Telephone Encounter (Signed)

## 2018-12-06 ENCOUNTER — Encounter: Payer: Self-pay | Admitting: Plastic Surgery

## 2018-12-06 ENCOUNTER — Other Ambulatory Visit: Payer: Self-pay

## 2018-12-06 ENCOUNTER — Ambulatory Visit (INDEPENDENT_AMBULATORY_CARE_PROVIDER_SITE_OTHER): Payer: BLUE CROSS/BLUE SHIELD | Admitting: Plastic Surgery

## 2018-12-06 DIAGNOSIS — L723 Sebaceous cyst: Secondary | ICD-10-CM | POA: Diagnosis not present

## 2018-12-06 NOTE — Progress Notes (Signed)
     Patient ID: Egan Zamor, female    DOB: 1980-12-25, 38 y.o.   MRN: [id number]   Chief Complaint  Patient presents with  . Advice Only    for cyst removal on the corner of (L) upper eye    The patient is a 38 year old female here for evaluation of her forehead.  She noticed a bump on her forehead between her eyes for several weeks.  It has now started to get larger and tender.  States also starting to get red and she is worried that it may get infected.  She is currently breast-feeding.  She is otherwise healthy and not had any recent issues or illnesses the lesion is 1.5 cm in size slightly mobile, tender, slightly red and raised.   Review of Systems  Constitutional: Negative.   HENT: Negative.   Eyes: Negative.   Respiratory: Negative.  Negative for chest tightness.   Cardiovascular: Negative.   Gastrointestinal: Negative.  Negative for abdominal pain.  Endocrine: Negative.   Genitourinary: Negative.   Musculoskeletal: Negative.  Negative for gait problem.  Skin: Positive for color change.  Psychiatric/Behavioral: Negative.     Past Medical History:  Diagnosis Date  . Allergy   . Anemia    hx of  . Hypothyroidism 2007   treated x 4 years, then apparently TSH normalized    Past Surgical History:  Procedure Laterality Date  . CESAREAN SECTION N/A 05/26/2018   Procedure: CESAREAN SECTION;  Surgeon: Olivia Mackie, MD;  Location: [facility] BIRTHING SUIT[facility];  Service: Obstetrics;  Laterality: N/A;  . VULVA SURGERY     as child  . WISDOM TOOTH EXTRACTION        Current Outpatient Medications:  .  NORLYDA 0.35 MG tablet, Take 1 tablet by mouth daily., Disp: , Rfl:  .  Prenatal Vit-Fe Fumarate-FA (PRENATAL PO), Take by mouth daily., Disp: , Rfl:    Objective:   Vitals:   12/06/18 1413  BP: 119/81  Pulse: 71  Temp: 98.6 F (37 C)  SpO2: 92%    Physical Exam Vitals signs and nursing note reviewed.  Constitutional:      Appearance: Normal appearance.  HENT:     Head: Normocephalic and atraumatic.      Nose: Nose normal.  Neck:     Musculoskeletal: Normal range of motion.  Cardiovascular:     Pulses: Normal pulses.  Pulmonary:     Effort: Pulmonary effort is normal.  Abdominal:     General: Abdomen is flat. There is no distension.  Neurological:     General: No focal deficit present.     Mental Status: She is alert and oriented to person, place, and time.  Psychiatric:        Mood and Affect: Mood normal.        Behavior: Behavior normal.     Assessment & Plan:  Sebaceous cyst  Recommend excision of the cyst of her forehead. Pictures were obtained of the patient and placed in the chart with the patient's or guardian's permission.   Alena Bills Catlynn Grondahl, DO

## 2018-12-25 ENCOUNTER — Encounter: Payer: Self-pay | Admitting: Plastic Surgery

## 2018-12-25 ENCOUNTER — Ambulatory Visit: Payer: BC Managed Care – PPO | Admitting: Plastic Surgery

## 2018-12-25 ENCOUNTER — Other Ambulatory Visit: Payer: Self-pay

## 2018-12-25 VITALS — BP 119/78 | HR 71 | Temp 97.8°F | Ht 65.0 in | Wt 155.2 lb

## 2018-12-25 DIAGNOSIS — L723 Sebaceous cyst: Secondary | ICD-10-CM | POA: Diagnosis not present

## 2018-12-25 MED ORDER — CIPROFLOXACIN HCL 500 MG PO TABS: 500.0000 mg | ORAL_TABLET | Freq: Two times a day (BID) | ORAL | 0 refills | Status: AC

## 2018-12-25 NOTE — Addendum Note (Signed)
Addended by: Wallace Going on: 12/25/2018 09:28 AM   Modules accepted: Orders

## 2018-12-25 NOTE — Progress Notes (Signed)
Preoperative Dx: sebaceous cyst of forehead  Postoperative Dx: Same  Procedure: excision of sebaceous cyst of forehead 5 mm  Surgeon: Dr. Lyndee Leo Dillingham  Anesthesia: Lidocaine 1% with 1:100,000 epinepherine  Indication for Procedure: cyst  Description of Procedure: Risks and complications were explained to the patient.  Consent was confirmed.  A time out was called and all information was confirmed to be correct.  The area was prepped with betadine and drapped.  Lidocaine 1% with epinepherine was injected in the subcutaneous area.  After waiting several minutes for the lidocaine to take affect a #15 blade was used to incise the area over the cyst.  The sac was not mature.  All material was removed from the 5 mm cyst.  A 6-0 Monocryl was used to close the skin edges.  A sterile dressing was applied.  The patient is to follow up in one week.  She tolerated the procedure well and there were no complications.

## 2019-01-01 ENCOUNTER — Telehealth: Payer: Self-pay | Admitting: Plastic Surgery

## 2019-01-01 NOTE — Telephone Encounter (Signed)

## 2019-01-02 ENCOUNTER — Encounter: Payer: Self-pay | Admitting: Plastic Surgery

## 2019-01-02 ENCOUNTER — Ambulatory Visit (INDEPENDENT_AMBULATORY_CARE_PROVIDER_SITE_OTHER): Payer: BC Managed Care – PPO | Admitting: Plastic Surgery

## 2019-01-02 ENCOUNTER — Other Ambulatory Visit: Payer: Self-pay

## 2019-01-02 VITALS — BP 120/74 | HR 70 | Temp 98.2°F | Ht 65.0 in | Wt 154.6 lb

## 2019-01-02 DIAGNOSIS — L723 Sebaceous cyst: Secondary | ICD-10-CM

## 2019-01-02 NOTE — Progress Notes (Signed)
The patient is here for follow-up after undergoing an excision of a cyst on her face.  The area is healing nicely.  I removed the stitch today.  And placed a placed a Steri-Strip in its place and recommend she do that for 2 weeks.  She also has an area on her left cheek that she is worried about.  It feels like another cyst under the skin.  Is getting larger and has doubled in size over the last week.  She would like to have this removed.  We also talked about sunblock and skin care. Pictures taken and placed in the patient's chart with the patient's permission. Recommend excision of left cheek cyst.

## 2019-01-09 ENCOUNTER — Encounter: Payer: Self-pay | Admitting: Plastic Surgery

## 2019-01-09 ENCOUNTER — Ambulatory Visit: Payer: BC Managed Care – PPO | Admitting: Plastic Surgery

## 2019-01-09 ENCOUNTER — Other Ambulatory Visit: Payer: Self-pay

## 2019-01-09 VITALS — BP 118/80 | HR 66 | Temp 98.0°F | Ht 65.0 in | Wt 152.0 lb

## 2019-01-09 DIAGNOSIS — L723 Sebaceous cyst: Secondary | ICD-10-CM

## 2019-01-09 NOTE — Progress Notes (Signed)
The patient is here for a follow up on her cyst excision of her forehead.  The area had a small amount of drainage last night.  She is breast feeding.  She does have some bactroban at home.  I recommend putting this on the area twice a day.  It may drain more and may need to be excised.  The area on her cheek is improving.  Virtual visit in one week.

## 2019-01-17 ENCOUNTER — Other Ambulatory Visit: Payer: Self-pay

## 2019-01-17 ENCOUNTER — Telehealth: Payer: Self-pay | Admitting: Plastic Surgery

## 2019-01-17 ENCOUNTER — Ambulatory Visit (INDEPENDENT_AMBULATORY_CARE_PROVIDER_SITE_OTHER): Payer: BC Managed Care – PPO | Admitting: Plastic Surgery

## 2019-01-17 ENCOUNTER — Encounter: Payer: Self-pay | Admitting: Plastic Surgery

## 2019-01-17 DIAGNOSIS — L723 Sebaceous cyst: Secondary | ICD-10-CM | POA: Diagnosis not present

## 2019-01-17 NOTE — Progress Notes (Signed)
The patient is a 38 year old female joining me by telemetry visit today.  She states that the area on her cheek is now getting larger and it appears to be a sebaceous cyst and she would like to have it removed.  I am happy to do that for her.  I actually can see the indentation of it under her skin on the left cheek.  The area on her forehead is not improving either from where we tried to remove the cyst.  We may need to excise this again. Plan for excision of left cheek cyst and forehead cyst.   The patient gave consent to have this visit done by telemedicine / virtual visit.  This is also consent for access the chart and treat the patient via this visit. The patient is located at home.  I, the provider, am at the office.  We spent 5 minutes together for the visit.

## 2019-01-17 NOTE — Telephone Encounter (Signed)
Call back to pt re: her question about continuing antibiotic cream as previously ordered Per Dr. Marla Roe- she is to use the cream once daily Pt understands the plan of care & will call for any other questions or concerns  [facility]

## 2019-01-17 NOTE — Telephone Encounter (Signed)
Patient called with a question regarding antibiotic cream. She had a televisit with Dr. Marla Roe today and forgot to ask her if she should continue applying the antibiotic cream to her forehead. She states that she is currently applying it twice daily to the area of the cyst on her forehead. Contact [phone]

## 2019-02-20 ENCOUNTER — Encounter: Payer: Self-pay | Admitting: Internal Medicine

## 2019-02-26 ENCOUNTER — Telehealth: Payer: Self-pay

## 2019-02-26 NOTE — Telephone Encounter (Signed)

## 2019-02-27 ENCOUNTER — Other Ambulatory Visit: Payer: Self-pay

## 2019-02-27 ENCOUNTER — Other Ambulatory Visit (HOSPITAL_COMMUNITY)
Admission: RE | Admit: 2019-02-27 | Discharge: 2019-02-27 | Disposition: A | Discharge status: Home / Self Care | Payer: BC Managed Care – PPO | Source: Ambulatory Visit | Attending: Plastic Surgery | Admitting: Plastic Surgery

## 2019-02-27 ENCOUNTER — Ambulatory Visit: Payer: BC Managed Care – PPO | Admitting: Plastic Surgery

## 2019-02-27 VITALS — BP 111/76 | HR 73 | Temp 98.0°F | Wt 150.0 lb

## 2019-02-27 DIAGNOSIS — L723 Sebaceous cyst: Secondary | ICD-10-CM | POA: Insufficient documentation

## 2019-02-27 DIAGNOSIS — L309 Dermatitis, unspecified: Secondary | ICD-10-CM | POA: Diagnosis not present

## 2019-02-27 NOTE — Progress Notes (Signed)
Preoperative Dx: Left cheek changing cyst and forehead cyst  Postoperative Dx: Same  Procedure: Excision of left cheek changing cyst 3 mm and re-excision of forehead cyst 2 mm  Surgeon: Dr. Lyndee Leo Dillingham  Anesthesia: Lidocaine 1% with 1:100,000 epinepherine  Indication for Procedure: Cyst  Description of Procedure: Risks and complications were explained to the patient.  Consent was confirmed.  Time out was called and all information was confirmed to be correct.  The area was prepped with betadine and drapped.  Lidocaine 1% with epinepherine was injected in the subcutaneous area.    Left cheek:  After waiting several minutes for the lidocaine to take affect a #15 blade was used to incise the skin over the lesion which was felt to be directly under the skin.   A 6-0 Monocryl was used to close the skin edges with simple interrupted stitches.  Steri strips were applied.   The specimen appeared to be adipose tissue.  The specimen was sent to pathology.  Forehead: The previous incision was excised with a #15 blade.  The skin was closed with 6-0 Monocryl simple interrupted sutures.  Steri-Strips were applied. The patient is to follow up in one week.  She tolerated the procedure well and there were no complications.

## 2019-03-06 NOTE — Progress Notes (Deleted)
Office Visit Note  Patient: Sharon Burch             Date of Birth: 09/03/1980           MRN: [id number]             PCP: Wanda PlumpPaz, Jose E, MD Referring: Wanda PlumpPaz, Jose E, MD Visit Date: 03/20/2019 Occupation: @GUAROCC @  Subjective:  No chief complaint on file.   History of Present Illness: Sharon Burch is a 38 y.o. female ***   Activities of Daily Living:  Patient reports morning stiffness for *** {minute/hour:19697}.   Patient {ACTIONS;DENIES/REPORTS:21021675::"Denies"} nocturnal pain.  Difficulty dressing/grooming: {ACTIONS;DENIES/REPORTS:21021675::"Denies"} Difficulty climbing stairs: {ACTIONS;DENIES/REPORTS:21021675::"Denies"} Difficulty getting out of chair: {ACTIONS;DENIES/REPORTS:21021675::"Denies"} Difficulty using hands for taps, buttons, cutlery, and/or writing: {ACTIONS;DENIES/REPORTS:21021675::"Denies"}  No Rheumatology ROS completed.   PMFS History:  Patient Active Problem List   Diagnosis Date Noted  . Sebaceous cyst 12/06/2018  . 1C/S - Indication: AOD and NRFHT 05/28/2018  . Postpartum care following cesarean delivery (11/8) 05/27/2018  . Encounter for induction of labor 05/26/2018  . 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 Raynaud disease 11/24/2016  . Hypothyroidism 07/19/2005    Past Medical History:  Diagnosis Date  . Allergy   . Anemia    hx of  . Hypothyroidism 2007   treated x 4 years, then apparently TSH normalized    Family History  Problem Relation Age of Onset  . Diabetes Maternal Grandmother   . Skin cancer Paternal Grandfather   . Healthy Mother   . Diabetes Mother        pre-diabetic   . Healthy Father   . Healthy Sister   . Healthy Sister   . Healthy Sister   . Heart failure Neg Hx   . Colon cancer Neg Hx   . Breast cancer Neg Hx    Past Surgical History:  Procedure Laterality Date  . CESAREAN SECTION N/A 05/26/2018   Procedure: CESAREAN SECTION;  Surgeon: Olivia Mackieaavon, Richard, MD;   [facility];  Service: Obstetrics;  Laterality: N/A;  . VULVA SURGERY     as child  . WISDOM TOOTH EXTRACTION     Social History   Social History Narrative   G0P0   Immunization History  Administered Date(s) Administered  . Tdap 03/07/2018     Objective: Vital Signs: There were no vitals taken for this visit.   Physical Exam   Musculoskeletal Exam: ***  CDAI Exam: CDAI Score: - Patient Global: -; Provider Global: - Swollen: -; Tender: - Joint Exam   No joint exam has been documented for this visit   There is currently no information documented on the homunculus. Go to the Rheumatology activity and complete the homunculus joint exam.  Investigation: No additional findings.  Imaging: No results found.  Recent Labs: Lab Results  Component Value Date   WBC 23.8 (H) 05/27/2018   HGB 11.2 (L) 05/27/2018   PLT 196 05/27/2018   NA 139 11/24/2016   K 4.0 11/24/2016   CL 103 11/24/2016   CO2 29 11/24/2016   GLUCOSE 92 11/24/2016   BUN 14 11/24/2016   CREATININE 0.69 11/24/2016   BILITOT 0.3 11/24/2016   ALKPHOS 37 (L) 11/24/2016   AST 18 11/24/2016   ALT 16 11/24/2016   PROT 7.9 11/24/2016   ALBUMIN 5.0 11/24/2016   CALCIUM 9.9 11/24/2016    Speciality Comments: No specialty comments available.  Procedures:  No procedures performed Allergies: Penicillins   Assessment / Plan:  Visit Diagnoses: No diagnosis found.  Orders: No orders of the defined types were placed in this encounter.  No orders of the defined types were placed in this encounter.   Face-to-face time spent with patient was *** minutes. Greater than 50% of time was spent in counseling and coordination of care.  Follow-Up Instructions: No follow-ups on file.   Earnestine Mealing, CMA  Note - This record has been created using Editor, commissioning.  Chart creation errors have been sought, but may not always  have been located. Such creation errors do not reflect on  the  standard of medical care.

## 2019-03-08 ENCOUNTER — Telehealth: Payer: Self-pay

## 2019-03-08 NOTE — Telephone Encounter (Signed)

## 2019-03-09 ENCOUNTER — Ambulatory Visit: Payer: BC Managed Care – PPO | Admitting: Surgical

## 2019-03-09 ENCOUNTER — Other Ambulatory Visit: Payer: Self-pay

## 2019-03-09 ENCOUNTER — Encounter: Payer: Self-pay | Admitting: Surgical

## 2019-03-09 VITALS — BP 113/77 | HR 71 | Temp 98.0°F | Ht 65.0 in | Wt 148.6 lb

## 2019-03-09 DIAGNOSIS — L723 Sebaceous cyst: Secondary | ICD-10-CM

## 2019-03-09 NOTE — Progress Notes (Signed)
Sharon Burch is here for follow-up after left cheek changing cyst and forehead cyst excision on 02/27/2019 with Dr. Marla Roe.  She has been doing well.  Pathology showed benign lesions.  Her incisions are clean, dry, intact.  No drainage noted.  No erythema.  No sign of seroma or hematoma.  Steri-Strips in place along the left cheek incision.  Sutures removed today new Steri-Strips placed.  She can continue to wear these for approximately 1 week and then remove them herself or allow them to fall off on their own.  She also had a question about some sun damage on her right cheek.  Spoke with Ong, RNFA and she is going to have some laser today.  No fevers, no chills.  Follow-up as needed.

## 2019-03-20 ENCOUNTER — Ambulatory Visit: Payer: BLUE CROSS/BLUE SHIELD | Admitting: Rheumatology

## 2019-04-24 DIAGNOSIS — Z3689 Encounter for other specified antenatal screening: Secondary | ICD-10-CM | POA: Diagnosis not present

## 2019-04-24 DIAGNOSIS — Z3200 Encounter for pregnancy test, result unknown: Secondary | ICD-10-CM | POA: Diagnosis not present

## 2019-06-08 DIAGNOSIS — Z3689 Encounter for other specified antenatal screening: Secondary | ICD-10-CM | POA: Diagnosis not present

## 2019-06-08 DIAGNOSIS — E039 Hypothyroidism, unspecified: Secondary | ICD-10-CM | POA: Diagnosis not present

## 2019-06-08 DIAGNOSIS — Z3A11 11 weeks gestation of pregnancy: Secondary | ICD-10-CM | POA: Diagnosis not present

## 2019-06-08 DIAGNOSIS — O99281 Endocrine, nutritional and metabolic diseases complicating pregnancy, first trimester: Secondary | ICD-10-CM | POA: Diagnosis not present

## 2019-06-08 DIAGNOSIS — O09521 Supervision of elderly multigravida, first trimester: Secondary | ICD-10-CM | POA: Diagnosis not present

## 2019-06-08 DIAGNOSIS — Z36 Encounter for antenatal screening for chromosomal anomalies: Secondary | ICD-10-CM | POA: Diagnosis not present

## 2019-07-05 DIAGNOSIS — O09522 Supervision of elderly multigravida, second trimester: Secondary | ICD-10-CM | POA: Diagnosis not present

## 2019-07-05 DIAGNOSIS — Z3A15 15 weeks gestation of pregnancy: Secondary | ICD-10-CM | POA: Diagnosis not present

## 2019-07-05 DIAGNOSIS — O09521 Supervision of elderly multigravida, first trimester: Secondary | ICD-10-CM | POA: Diagnosis not present

## 2019-07-05 DIAGNOSIS — Z361 Encounter for antenatal screening for raised alphafetoprotein level: Secondary | ICD-10-CM | POA: Diagnosis not present

## 2019-08-03 DIAGNOSIS — Z3A23 23 weeks gestation of pregnancy: Secondary | ICD-10-CM | POA: Diagnosis not present

## 2019-08-03 DIAGNOSIS — Z3480 Encounter for supervision of other normal pregnancy, unspecified trimester: Secondary | ICD-10-CM | POA: Diagnosis not present

## 2019-08-03 DIAGNOSIS — Z3689 Encounter for other specified antenatal screening: Secondary | ICD-10-CM | POA: Diagnosis not present

## 2019-08-03 DIAGNOSIS — Z3A19 19 weeks gestation of pregnancy: Secondary | ICD-10-CM | POA: Diagnosis not present

## 2019-08-03 DIAGNOSIS — Z3A27 27 weeks gestation of pregnancy: Secondary | ICD-10-CM | POA: Diagnosis not present

## 2019-08-03 DIAGNOSIS — O09522 Supervision of elderly multigravida, second trimester: Secondary | ICD-10-CM | POA: Diagnosis not present

## 2019-08-30 DIAGNOSIS — O09522 Supervision of elderly multigravida, second trimester: Secondary | ICD-10-CM | POA: Diagnosis not present

## 2019-08-30 DIAGNOSIS — Z3A23 23 weeks gestation of pregnancy: Secondary | ICD-10-CM | POA: Diagnosis not present

## 2019-10-05 DIAGNOSIS — Z3A28 28 weeks gestation of pregnancy: Secondary | ICD-10-CM | POA: Diagnosis not present

## 2019-10-05 DIAGNOSIS — Z3689 Encounter for other specified antenatal screening: Secondary | ICD-10-CM | POA: Diagnosis not present

## 2019-10-05 DIAGNOSIS — O09523 Supervision of elderly multigravida, third trimester: Secondary | ICD-10-CM | POA: Diagnosis not present

## 2019-10-05 DIAGNOSIS — O99810 Abnormal glucose complicating pregnancy: Secondary | ICD-10-CM | POA: Diagnosis not present
